# Patient Record
Sex: Female | Born: 1990 | Race: Black or African American | Hispanic: No | Marital: Single | Smoking: Former smoker
Health system: Southern US, Community
[De-identification: ages and names within clinical notes are randomized; demographics above are authoritative.]

## PROBLEM LIST (undated history)

## (undated) ENCOUNTER — Inpatient Hospital Stay: Payer: Self-pay

## (undated) DIAGNOSIS — F329 Major depressive disorder, single episode, unspecified: Secondary | ICD-10-CM

## (undated) DIAGNOSIS — O039 Complete or unspecified spontaneous abortion without complication: Secondary | ICD-10-CM

## (undated) DIAGNOSIS — F32A Depression, unspecified: Secondary | ICD-10-CM

## (undated) HISTORY — PX: OTHER SURGICAL HISTORY: SHX169

---

## 2016-05-22 ENCOUNTER — Emergency Department
Admission: EM | Admit: 2016-05-22 | Discharge: 2016-05-22 | Disposition: A | Discharge status: Home / Self Care | Payer: Self-pay | Attending: Student in an Organized Health Care Education/Training Program | Admitting: Student in an Organized Health Care Education/Training Program

## 2016-05-22 DIAGNOSIS — F32A Depression, unspecified: Secondary | ICD-10-CM

## 2016-05-22 DIAGNOSIS — R45851 Suicidal ideations: Secondary | ICD-10-CM

## 2016-05-22 DIAGNOSIS — F329 Major depressive disorder, single episode, unspecified: Secondary | ICD-10-CM | POA: Insufficient documentation

## 2016-05-22 DIAGNOSIS — F17210 Nicotine dependence, cigarettes, uncomplicated: Secondary | ICD-10-CM | POA: Insufficient documentation

## 2016-05-22 DIAGNOSIS — Z5181 Encounter for therapeutic drug level monitoring: Secondary | ICD-10-CM | POA: Insufficient documentation

## 2016-05-22 DIAGNOSIS — F1290 Cannabis use, unspecified, uncomplicated: Secondary | ICD-10-CM | POA: Insufficient documentation

## 2016-05-22 HISTORY — DX: Major depressive disorder, single episode, unspecified: F32.9

## 2016-05-22 HISTORY — DX: Depression, unspecified: F32.A

## 2016-05-22 LAB — COMPREHENSIVE METABOLIC PANEL
ALT: 9 U/L — ABNORMAL LOW (ref 14–54)
AST: 18 U/L (ref 15–41)
Albumin: 3.9 g/dL (ref 3.5–5.0)
Alkaline Phosphatase: 50 U/L (ref 38–126)
Anion gap: 6 (ref 5–15)
BUN: 9 mg/dL (ref 6–20)
CHLORIDE: 102 mmol/L (ref 101–111)
CO2: 29 mmol/L (ref 22–32)
CREATININE: 0.64 mg/dL (ref 0.44–1.00)
Calcium: 9.3 mg/dL (ref 8.9–10.3)
GFR calc Af Amer: >60 mL/min (ref >60)
GFR calc non Af Amer: >60 mL/min (ref >60)
GLUCOSE: 94 mg/dL (ref 65–99)
Potassium: 3.9 mmol/L (ref 3.5–5.1)
Sodium: 137 mmol/L (ref 135–145)
Total Bilirubin: 0.6 mg/dL (ref 0.3–1.2)
Total Protein: 7.9 g/dL (ref 6.5–8.1)

## 2016-05-22 LAB — RAPID HIV SCREEN (HIV 1/2 AB+AG)
HIV 1/2 Antibodies: NONREACTIVE
HIV-1 P24 Antigen - HIV24: NONREACTIVE

## 2016-05-22 LAB — CBC
HCT: 40.3 % (ref 35.0–47.0)
HEMOGLOBIN: 14.1 g/dL (ref 12.0–16.0)
MCH: 33.8 pg (ref 26.0–34.0)
MCHC: 34.9 g/dL (ref 32.0–36.0)
MCV: 96.7 fL (ref 80.0–100.0)
Platelets: 269 10*3/uL (ref 150–440)
RBC: 4.17 MIL/uL (ref 3.80–5.20)
RDW: 12.8 % (ref 11.5–14.5)
WBC: 7.8 10*3/uL (ref 3.6–11.0)

## 2016-05-22 LAB — URINE DRUG SCREEN, QUALITATIVE (ARMC ONLY)
AMPHETAMINES, UR SCREEN: NOT DETECTED
Barbiturates, Ur Screen: NOT DETECTED
Benzodiazepine, Ur Scrn: NOT DETECTED
CANNABINOID 50 NG, UR ~~LOC~~: POSITIVE — AB
COCAINE METABOLITE, UR ~~LOC~~: NOT DETECTED
MDMA (ECSTASY) UR SCREEN: NOT DETECTED
Methadone Scn, Ur: NOT DETECTED
Opiate, Ur Screen: NOT DETECTED
PHENCYCLIDINE (PCP) UR S: NOT DETECTED
Tricyclic, Ur Screen: NOT DETECTED

## 2016-05-22 LAB — ETHANOL: Alcohol, Ethyl (B): <5 mg/dL (ref <5)

## 2016-05-22 LAB — SALICYLATE LEVEL: Salicylate Lvl: <7 mg/dL (ref 2.8–30.0)

## 2016-05-22 LAB — ACETAMINOPHEN LEVEL: Acetaminophen (Tylenol), Serum: <10 ug/mL — ABNORMAL LOW (ref 10–30)

## 2016-05-22 NOTE — ED Notes (Signed)
SOC set up on pt's room.

## 2016-05-22 NOTE — ED Provider Notes (Addendum)
[facility] Emergency Department Provider Note    First MD Initiated Contact with Patient 05/22/16 1828     (approximate)  I have reviewed the triage vital signs and the nursing notes.   HISTORY  Chief Complaint Suicidal    HPI Belinda Gray is a 26 y.o. female presents with feelings of suicide and attempt of cutting her wrist and attempt to kill herself. Patient has felt more depressed and suicidal today after learning that her recent boyfriend was HIV-positive she found out about this today. She does have a history of suicide attempt to try to cut her wrist. Had similar feelings today. States that she is still feeling suicidal and would plan to cut her wrist. Hallucinations. Not on any medications for depression or anxiety.   Past Medical History:  Diagnosis Date  . Depression    FMH: no bleeding disorders History reviewed. No pertinent surgical history. There are no active problems to display for this patient.     Prior to Admission medications   Not on File    Allergies Patient has no known allergies.    Social History Social History  Substance Use Topics  . Smoking status: Current Every Day Smoker    Packs/day: 0.50    Types: Cigarettes  . Smokeless tobacco: Never Used  . Alcohol use Yes     Comment: occasional     Review of Systems Patient denies headaches, rhinorrhea, blurry vision, numbness, shortness of breath, chest pain, edema, cough, abdominal pain, nausea, vomiting, diarrhea, dysuria, fevers, rashes or hallucinations unless otherwise stated above in HPI. ____________________________________________   PHYSICAL EXAM:  VITAL SIGNS: Vitals:   05/22/16 1729  BP: 115/69  Pulse: 81  Resp: 18  Temp: 98 F (36.7 C)    Constitutional: Alert and oriented. Well appearing and in no acute distress. Eyes: Conjunctivae are normal. PERRL. EOMI. Head: Atraumatic. Nose: No congestion/rhinnorhea. Mouth/Throat: Mucous  membranes are moist.  Oropharynx non-erythematous. Neck: No stridor. Painless ROM. No cervical spine tenderness to palpation Hematological/Lymphatic/Immunilogical: No cervical lymphadenopathy. Cardiovascular: Normal rate, regular rhythm. Grossly normal heart sounds.  Good peripheral circulation. Respiratory: Normal respiratory effort.  No retractions. Lungs CTAB. Gastrointestinal: Soft and nontender. No distention. No abdominal bruits. No CVA tenderness. Genitourinary:  Musculoskeletal: No lower extremity tenderness nor edema.  No joint effusions. Neurologic:  Normal speech and language. No gross focal neurologic deficits are appreciated. No gait instability. Skin:  Skin is warm, dry and intact. No rash noted. Psychiatric: Mood and affect are normal. Speech and behavior are normal.  ____________________________________________   LABS (all labs ordered are listed, but only abnormal results are displayed)  Results for orders placed or performed during the hospital encounter of 05/22/16 (from the past 24 hour(s))  Comprehensive metabolic panel     Status: Abnormal   Collection Time: 05/22/16  5:35 PM  Result Value Ref Range   Sodium 137 135 - 145 mmol/L   Potassium 3.9 3.5 - 5.1 mmol/L   Chloride 102 101 - 111 mmol/L   CO2 29 22 - 32 mmol/L   Glucose, Bld 94 65 - 99 mg/dL   BUN 9 6 - 20 mg/dL   Creatinine, Ser 1.61 0.44 - 1.00 mg/dL   Calcium 9.3 8.9 - 09.6 mg/dL   Total Protein 7.9 6.5 - 8.1 g/dL   Albumin 3.9 3.5 - 5.0 g/dL   AST 18 15 - 41 U/L   ALT 9 (L) 14 - 54 U/L   Alkaline Phosphatase 50 38 - 126  U/L   Total Bilirubin 0.6 0.3 - 1.2 mg/dL   GFR calc non Af Amer >60 >60 mL/min   GFR calc Af Amer >60 >60 mL/min   Anion gap 6 5 - 15  Ethanol     Status: None   Collection Time: 05/22/16  5:35 PM  Result Value Ref Range   Alcohol, Ethyl (B) <5 <5 mg/dL  Salicylate level     Status: None   Collection Time: 05/22/16  5:35 PM  Result Value Ref Range   Salicylate Lvl <7.0 2.8  - 30.0 mg/dL  Acetaminophen level     Status: Abnormal   Collection Time: 05/22/16  5:35 PM  Result Value Ref Range   Acetaminophen (Tylenol), Serum <10 (L) 10 - 30 ug/mL  cbc     Status: None   Collection Time: 05/22/16  5:35 PM  Result Value Ref Range   WBC 7.8 3.6 - 11.0 K/uL   RBC 4.17 3.80 - 5.20 MIL/uL   Hemoglobin 14.1 12.0 - 16.0 g/dL   HCT 54.0 98.1 - 19.1 %   MCV 96.7 80.0 - 100.0 fL   MCH 33.8 26.0 - 34.0 pg   MCHC 34.9 32.0 - 36.0 g/dL   RDW 47.8 29.5 - 62.1 %   Platelets 269 150 - 440 K/uL  Urine Drug Screen, Qualitative     Status: Abnormal   Collection Time: 05/22/16  5:35 PM  Result Value Ref Range   Tricyclic, Ur Screen NONE DETECTED NONE DETECTED   Amphetamines, Ur Screen NONE DETECTED NONE DETECTED   MDMA (Ecstasy)Ur Screen NONE DETECTED NONE DETECTED   Cocaine Metabolite,Ur [place] NONE DETECTED NONE DETECTED   Opiate, Ur Screen NONE DETECTED NONE DETECTED   Phencyclidine (PCP) Ur S NONE DETECTED NONE DETECTED   Cannabinoid 50 Ng, Ur [place] POSITIVE (A) NONE DETECTED   Barbiturates, Ur Screen NONE DETECTED NONE DETECTED   Benzodiazepine, Ur Scrn NONE DETECTED NONE DETECTED   Methadone Scn, Ur NONE DETECTED NONE DETECTED   ____________________________________________  EKG____________________________________________  RADIOLOGY   ____________________________________________   PROCEDURES  Procedure(s) performed:  Procedures    Critical Care performed: no ____________________________________________   INITIAL IMPRESSION / ASSESSMENT AND PLAN / ED COURSE  Pertinent labs & imaging results that were available during my care of the patient were reviewed by me and considered in my medical decision making (see chart for details).  DDX: Psychosis, delirium, medication effect, noncompliance, polysubstance abuse, Si, Hi, depression   Belinda Gray is a 26 y.o. who presents to the ED with for evaluation of SI.  Patient has psych history of depression.   Laboratory testing was ordered to evaluation for underlying electrolyte derangement or signs of underlying organic pathology to explain today's presentation.  Based on history and physical and laboratory evaluation, it appears that the patient's presentation is 2/2 underlying psychiatric disorder and will require further evaluation and management by inpatient psychiatry.  Patient was  made an IVC due to suicidal ideations with plan.  Disposition pending psychiatric evaluation.     ----------------------------------------- 8:14 PM on 05/22/2016 -----------------------------------------  The patient has been evaluated at by [facility], psychiatry.  Patient is clinically stable.  Not felt to be a danger to self or others.  No SI or Hi.  No indication for inpatient psychiatric admission at this time.  Appropriate for continued outpatient therapy.   ____________________________________________   FINAL CLINICAL IMPRESSION(S) / ED DIAGNOSES  Final diagnoses:  Suicidal ideation  Depression, unspecified depression type      NEW  MEDICATIONS STARTED DURING THIS VISIT:  New Prescriptions   No medications on file     Note:  This document was prepared using Dragon voice recognition software and may include unintentional dictation errors.     Willy EddyPatrick Riki Gehring, MD 05/22/16 1903    Willy EddyPatrick Shamira Toutant, MD 05/22/16 2014

## 2016-05-22 NOTE — [facility] Counselor (Signed)
Patient presenting to the ED with suicidal thoughts of cutting her wrists, triggered by finding out that her boyfriend tested positive for HIV.  [facility] with follow up with outpatient therapy.  TTS Counselor will provide list of resources before discharge.

## 2016-05-22 NOTE — ED Notes (Signed)
[phone]  Joy, pts friend

## 2016-05-22 NOTE — ED Triage Notes (Signed)
Pt bib [employer] w/ SI. Pt sts that she recently found out former partner informed pt that he/she was HIV positive. Pt sts that she was texting crisis hotline who call [employer] to bring pt in.  Officer sts that paper work is coming.  Pt calm and cooperative at this time.  Denies plans at this time, no HI.  Denies drugs and ETOH use.

## 2016-07-17 DIAGNOSIS — O039 Complete or unspecified spontaneous abortion without complication: Secondary | ICD-10-CM

## 2016-07-17 HISTORY — DX: Complete or unspecified spontaneous abortion without complication: O03.9

## 2016-07-25 ENCOUNTER — Emergency Department
Admission: EM | Admit: 2016-07-25 | Discharge: 2016-07-25 | Disposition: A | Discharge status: Home / Self Care | Payer: Self-pay | Attending: Student in an Organized Health Care Education/Training Program | Admitting: Student in an Organized Health Care Education/Training Program

## 2016-07-25 ENCOUNTER — Emergency Department: Payer: Self-pay

## 2016-07-25 ENCOUNTER — Encounter: Payer: Self-pay | Admitting: Emergency Medicine

## 2016-07-25 DIAGNOSIS — R1031 Right lower quadrant pain: Secondary | ICD-10-CM | POA: Insufficient documentation

## 2016-07-25 DIAGNOSIS — F17210 Nicotine dependence, cigarettes, uncomplicated: Secondary | ICD-10-CM | POA: Insufficient documentation

## 2016-07-25 DIAGNOSIS — R102 Pelvic and perineal pain: Secondary | ICD-10-CM | POA: Insufficient documentation

## 2016-07-25 LAB — CBC WITH DIFFERENTIAL/PLATELET
Basophils Absolute: 0.1 10*3/uL (ref 0–0.1)
Basophils Relative: 1 %
EOS PCT: 2 %
Eosinophils Absolute: 0.1 10*3/uL (ref 0–0.7)
HCT: 35.4 % (ref 35.0–47.0)
Hemoglobin: 12.3 g/dL (ref 12.0–16.0)
LYMPHS ABS: 2.4 10*3/uL (ref 1.0–3.6)
LYMPHS PCT: 33 %
MCH: 32.9 pg (ref 26.0–34.0)
MCHC: 34.6 g/dL (ref 32.0–36.0)
MCV: 95.1 fL (ref 80.0–100.0)
Monocytes Absolute: 0.9 10*3/uL (ref 0.2–0.9)
Monocytes Relative: 12 %
Neutro Abs: 3.8 10*3/uL (ref 1.4–6.5)
Neutrophils Relative %: 52 %
PLATELETS: 213 10*3/uL (ref 150–440)
RBC: 3.73 MIL/uL — ABNORMAL LOW (ref 3.80–5.20)
RDW: 13 % (ref 11.5–14.5)
WBC: 7.3 10*3/uL (ref 3.6–11.0)

## 2016-07-25 LAB — BASIC METABOLIC PANEL
Anion gap: 5 (ref 5–15)
BUN: 13 mg/dL (ref 6–20)
CALCIUM: 8.7 mg/dL — AB (ref 8.9–10.3)
CO2: 26 mmol/L (ref 22–32)
Chloride: 104 mmol/L (ref 101–111)
Creatinine, Ser: 0.72 mg/dL (ref 0.44–1.00)
GFR calc Af Amer: >60 mL/min (ref >60)
GLUCOSE: 101 mg/dL — AB (ref 65–99)
POTASSIUM: 4.1 mmol/L (ref 3.5–5.1)
Sodium: 135 mmol/L (ref 135–145)

## 2016-07-25 LAB — URINALYSIS, COMPLETE (UACMP) WITH MICROSCOPIC
BACTERIA UA: NONE SEEN
Bilirubin Urine: NEGATIVE
Glucose, UA: NEGATIVE mg/dL
Hgb urine dipstick: NEGATIVE
KETONES UR: NEGATIVE mg/dL
Leukocytes, UA: NEGATIVE
Nitrite: NEGATIVE
PROTEIN: NEGATIVE mg/dL
Specific Gravity, Urine: 1.025 (ref 1.005–1.030)
pH: 5 (ref 5.0–8.0)

## 2016-07-25 LAB — WET PREP, GENITAL
CLUE CELLS WET PREP: NONE SEEN
Sperm: NONE SEEN
Trich, Wet Prep: NONE SEEN
Yeast Wet Prep HPF POC: NONE SEEN

## 2016-07-25 LAB — CHLAMYDIA/NGC RT PCR (ARMC ONLY)
CHLAMYDIA TR: NOT DETECTED
N gonorrhoeae: NOT DETECTED

## 2016-07-25 LAB — POCT PREGNANCY, URINE: PREG TEST UR: NEGATIVE

## 2016-07-25 MED ORDER — SODIUM CHLORIDE 0.9 % IV BOLUS (SEPSIS)
1000.0000 mL | Freq: Once | INTRAVENOUS | Status: AC
  Administered 2016-07-25: 1000 mL via INTRAVENOUS

## 2016-07-25 MED ORDER — IOPAMIDOL (ISOVUE-300) INJECTION 61%
100.0000 mL | Freq: Once | INTRAVENOUS | Status: AC | PRN
  Administered 2016-07-25: 100 mL via INTRAVENOUS
  Filled 2016-07-25: qty 100

## 2016-07-25 MED ORDER — DICYCLOMINE HCL 10 MG PO CAPS
10.0000 mg | ORAL_CAPSULE | Freq: Three times a day (TID) | ORAL | 0 refills | Status: DC | PRN
Start: 2016-07-25 — End: 2016-11-22

## 2016-07-25 NOTE — ED Provider Notes (Signed)
[facility] Emergency Department Provider Note    First MD Initiated Contact with Patient 07/25/16 [name]     (approximate)  I have reviewed the triage vital signs and the nursing notes.   HISTORY  Chief Complaint Pelvic Pain    HPI Belinda Gray is a 26 y.o. female presents with pelvic pain with nausea for the past 2-3 days. States that she is late on her period by roughly 1 week. Took a home pregnancy test that was negative. States the pain is crampy in nature. There is some right lower quadrant. States that she did have some watery vaginal discharge that she thought was her period coming on but it was not. She denies any dysuria. No fevers.   Past Medical History:  Diagnosis Date  . Depression    FMH: no bleeding disorders History reviewed. No pertinent surgical history. There are no active problems to display for this patient.     Prior to Admission medications   Not on File    Allergies Patient has no known allergies.    Social History Social History  Substance Use Topics  . Smoking status: Current Every Day Smoker    Packs/day: 0.50    Types: Cigarettes  . Smokeless tobacco: Never Used  . Alcohol use Yes     Comment: occasional     Review of Systems Patient denies headaches, rhinorrhea, blurry vision, numbness, shortness of breath, chest pain, edema, cough, abdominal pain, nausea, vomiting, diarrhea, dysuria, fevers, rashes or hallucinations unless otherwise stated above in HPI. ____________________________________________   PHYSICAL EXAM:  VITAL SIGNS: Vitals:   07/25/16 1740  BP: 117/68  Pulse: 83  Resp: 18  Temp: 98.7 F (37.1 C)    Constitutional: Alert and oriented. Well appearing and in no acute distress. Eyes: Conjunctivae are normal. PERRL. EOMI. Head: Atraumatic. Nose: No congestion/rhinnorhea. Mouth/Throat: Mucous membranes are moist.  Oropharynx non-erythematous. Neck: No stridor. Painless ROM. No  cervical spine tenderness to palpation Hematological/Lymphatic/Immunilogical: No cervical lymphadenopathy. Cardiovascular: Normal rate, regular rhythm. Grossly normal heart sounds.  Good peripheral circulation. Respiratory: Normal respiratory effort.  No retractions. Lungs CTAB. Gastrointestinal: Soft and nontender. No distention. No abdominal bruits. No CVA tenderness. Genitourinary: no CMT.  No adnexal masses,  Small area of irritated tissue to cervix at 11 oclock, no bleeding Musculoskeletal: No lower extremity tenderness nor edema.  No joint effusions. Neurologic:  Normal speech and language. No gross focal neurologic deficits are appreciated. No gait instability. Skin:  Skin is warm, dry and intact. No rash noted. Psychiatric: Mood and affect are normal. Speech and behavior are normal.  ____________________________________________   LABS (all labs ordered are listed, but only abnormal results are displayed)  Results for orders placed or performed during the hospital encounter of 07/25/16 (from the past 24 hour(s))  Urinalysis, Complete w Microscopic     Status: Abnormal   Collection Time: 07/25/16  5:53 PM  Result Value Ref Range   Color, Urine YELLOW (A) YELLOW   APPearance HAZY (A) CLEAR   Specific Gravity, Urine 1.025 1.005 - 1.030   pH 5.0 5.0 - 8.0   Glucose, UA NEGATIVE NEGATIVE mg/dL   Hgb urine dipstick NEGATIVE NEGATIVE   Bilirubin Urine NEGATIVE NEGATIVE   Ketones, ur NEGATIVE NEGATIVE mg/dL   Protein, ur NEGATIVE NEGATIVE mg/dL   Nitrite NEGATIVE NEGATIVE   Leukocytes, UA NEGATIVE NEGATIVE   RBC / HPF 0-5 0 - 5 RBC/hpf   WBC, UA 0-5 0 - 5 WBC/hpf   Bacteria,  UA NONE SEEN NONE SEEN   Squamous Epithelial / LPF 0-5 (A) NONE SEEN   Mucous PRESENT   Pregnancy, urine POC     Status: None   Collection Time: 07/25/16  5:59 PM  Result Value Ref Range   Preg Test, Ur NEGATIVE NEGATIVE    ____________________________________________  EKG____________________________________________  RADIOLOGY  I personally reviewed all radiographic images ordered to evaluate for the above acute complaints and reviewed radiology reports and findings.  These findings were personally discussed with the patient.  Please see medical record for radiology report.  ____________________________________________   PROCEDURES  Procedure(s) performed:  Procedures    Critical Care performed: no ____________________________________________   INITIAL IMPRESSION / ASSESSMENT AND PLAN / ED COURSE  Pertinent labs & imaging results that were available during my care of the patient were reviewed by me and considered in my medical decision making (see chart for details).  DDX: cyst, ectopic, uti, pid, appy  Belinda Gray is a 26 y.o. who presents to the ED with above complaints. Patient is AFVSS in ED. Exam as above. Given current presentation have considered the above differential.  Patient complains pelvic exam was performed which shows no evidence of PID. No significant discharge. Will be sent for cultures. Ultrasound ordered to evaluate for evidence of torsion or cyst. Her urine pregnancy is negative therefore not consistent with ectopic.   Clinical Course as of Jul 26 2227  Wed Jul 25, 2016  2120 Patient stating that she did remember earlier this morning that she did have some periumbilical pain. In the setting of a normal pelvic ultrasound with no evidence of significant infection do feel that CT imaging is indicated to evaluate for evidence of acute appendicitis.  [PR]    Clinical Course User Index [PR] Willy Eddy, MD   ----------------------------------------- 10:32 PM on 07/25/2016 -----------------------------------------  CT imaging with no acute abnormality. At this point do feel patient is stable for discharge home. Repeat abdominal exam not consistent with acute  surgical process.   Patient was able to tolerate PO and was able to ambulate with a steady gait.  Have discussed with the patient and available family all diagnostics and treatments performed thus far and all questions were answered to the best of my ability. The patient demonstrates understanding and agreement with plan.  ____________________________________________   FINAL CLINICAL IMPRESSION(S) / ED DIAGNOSES  Final diagnoses:  Pelvic pain  Pelvic pain in female  Right lower quadrant abdominal pain      NEW MEDICATIONS STARTED DURING THIS VISIT:  New Prescriptions   No medications on file     Note:  This document was prepared using Dragon voice recognition software and may include unintentional dictation errors.    Willy Eddy, MD 07/25/16 2233

## 2016-07-25 NOTE — ED Triage Notes (Signed)
C/O pelvic pain and nausea x 2-3 days.  LMP:  06/16/2016.  Home pregnancy test negative.  Denies dysuria.  NOticed watery vaginal discharge today.

## 2016-07-25 NOTE — Discharge Instructions (Signed)

## 2016-07-26 ENCOUNTER — Encounter: Payer: Self-pay | Admitting: Emergency Medicine

## 2016-07-26 ENCOUNTER — Emergency Department
Admission: EM | Admit: 2016-07-26 | Discharge: 2016-07-26 | Disposition: A | Discharge status: Home / Self Care | Payer: Self-pay | Attending: Emergency Medicine | Admitting: Emergency Medicine

## 2016-07-26 DIAGNOSIS — R1030 Lower abdominal pain, unspecified: Secondary | ICD-10-CM | POA: Insufficient documentation

## 2016-07-26 DIAGNOSIS — F17210 Nicotine dependence, cigarettes, uncomplicated: Secondary | ICD-10-CM | POA: Insufficient documentation

## 2016-07-26 DIAGNOSIS — R102 Pelvic and perineal pain: Secondary | ICD-10-CM | POA: Insufficient documentation

## 2016-07-26 LAB — CBC
HCT: 39.6 % (ref 35.0–47.0)
HEMOGLOBIN: 13.9 g/dL (ref 12.0–16.0)
MCH: 33.6 pg (ref 26.0–34.0)
MCHC: 35 g/dL (ref 32.0–36.0)
MCV: 96 fL (ref 80.0–100.0)
Platelets: 200 10*3/uL (ref 150–440)
RBC: 4.13 MIL/uL (ref 3.80–5.20)
RDW: 12.6 % (ref 11.5–14.5)
WBC: 6 10*3/uL (ref 3.6–11.0)

## 2016-07-26 LAB — COMPREHENSIVE METABOLIC PANEL
ALBUMIN: 3.8 g/dL (ref 3.5–5.0)
ALK PHOS: 46 U/L (ref 38–126)
ALT: 10 U/L — AB (ref 14–54)
ANION GAP: 9 (ref 5–15)
AST: 17 U/L (ref 15–41)
BUN: 9 mg/dL (ref 6–20)
CHLORIDE: 103 mmol/L (ref 101–111)
CO2: 23 mmol/L (ref 22–32)
CREATININE: 0.56 mg/dL (ref 0.44–1.00)
Calcium: 9.1 mg/dL (ref 8.9–10.3)
GFR calc Af Amer: >60 mL/min (ref >60)
GFR calc non Af Amer: >60 mL/min (ref >60)
GLUCOSE: 103 mg/dL — AB (ref 65–99)
Potassium: 3.7 mmol/L (ref 3.5–5.1)
SODIUM: 135 mmol/L (ref 135–145)
Total Bilirubin: 0.7 mg/dL (ref 0.3–1.2)
Total Protein: 7.6 g/dL (ref 6.5–8.1)

## 2016-07-26 LAB — URINALYSIS, COMPLETE (UACMP) WITH MICROSCOPIC
BACTERIA UA: NONE SEEN
Bilirubin Urine: NEGATIVE
Glucose, UA: NEGATIVE mg/dL
Ketones, ur: NEGATIVE mg/dL
Leukocytes, UA: NEGATIVE
Nitrite: NEGATIVE
PROTEIN: NEGATIVE mg/dL
SPECIFIC GRAVITY, URINE: 1.02 (ref 1.005–1.030)
pH: 8 (ref 5.0–8.0)

## 2016-07-26 LAB — LIPASE, BLOOD: LIPASE: 17 U/L (ref 11–51)

## 2016-07-26 MED ORDER — KETOROLAC TROMETHAMINE 30 MG/ML IJ SOLN
30.0000 mg | Freq: Once | INTRAMUSCULAR | Status: AC
  Administered 2016-07-26: 30 mg via INTRAVENOUS
  Filled 2016-07-26: qty 1

## 2016-07-26 MED ORDER — KETOROLAC TROMETHAMINE 10 MG PO TABS: 10.0000 mg | ORAL_TABLET | Freq: Three times a day (TID) | ORAL | 0 refills | Status: DC | PRN

## 2016-07-26 NOTE — Discharge Instructions (Signed)
Please seek medical attention for any high fevers, chest pain, shortness of breath, change in behavior, persistent vomiting, bloody stool or any other new or concerning symptoms.  

## 2016-07-26 NOTE — ED Triage Notes (Signed)
Pt reports pelvic pain, was here yesterday for same and d/c'ed home; pt reports pain has gotten worse so came back

## 2016-07-26 NOTE — ED Provider Notes (Signed)
[facility] Emergency Department Provider Note   ____________________________________________   I have reviewed the triage vital signs and the nursing notes.   HISTORY  Chief Complaint Abdominal Pain   History limited by: Not Limited   HPI Belinda Gray is a 26 y.o. female who presents to the emergency department today with concerns for continued abdominal pain. Patient states she has been having some cramping for the past 10 days. It has gotten worse the past couple of days. She was seen in the emergency department yesterday for this and underwent pelvic exam, pelvic ultrasound as well as CT scan. None of the tests came back with an obvious etiology of the patient's pain. She was prescribed Bentyl however has not taken any. Today she did notice some vaginal bleeding. Additionally she felt warm and had a temperature of 99.2.    Past Medical History:  Diagnosis Date  . Depression     There are no active problems to display for this patient.   History reviewed. No pertinent surgical history.  Prior to Admission medications   Medication Sig Start Date End Date Taking? Authorizing Provider  dicyclomine (BENTYL) 10 MG capsule Take 1 capsule (10 mg total) by mouth 3 (three) times daily as needed for spasms. 07/25/16 08/08/16  Willy Eddyobinson, Patrick, MD    Allergies Patient has no known allergies.  History reviewed. No pertinent family history.  Social History Social History  Substance Use Topics  . Smoking status: Current Every Day Smoker    Packs/day: 0.50    Types: Cigarettes  . Smokeless tobacco: Never Used  . Alcohol use Yes     Comment: occasional     Review of Systems Constitutional: No fever/chills Eyes: No visual changes. ENT: No sore throat. Cardiovascular: Denies chest pain. Respiratory: Denies shortness of breath. Gastrointestinal: Positive for abdominal pain. Genitourinary: Negative for dysuria. Musculoskeletal: Negative for back  pain. Skin: Negative for rash. Neurological: Negative for headaches, focal weakness or numbness.  ____________________________________________   PHYSICAL EXAM:  VITAL SIGNS: ED Triage Vitals  Enc Vitals Group     BP 07/26/16 1837 (!) 125/97     Pulse Rate 07/26/16 1837 86     Resp 07/26/16 1837 19     Temp 07/26/16 1837 98.2 F (36.8 C)     Temp Source 07/26/16 1837 Oral     SpO2 07/26/16 1837 100 %     Weight 07/26/16 1838 222 lb (100.7 kg)     Height 07/26/16 1838 5\' 6"  (1.676 m)     Head Circumference --      Peak Flow --      Pain Score 07/26/16 1841 8   Constitutional: Alert and oriented. Well appearing and in no distress. Eyes: Conjunctivae are normal. Normal extraocular movements. ENT   Head: Normocephalic and atraumatic.   Nose: No congestion/rhinnorhea.   Mouth/Throat: Mucous membranes are moist.   Neck: No stridor. Hematological/Lymphatic/Immunilogical: No cervical lymphadenopathy. Cardiovascular: Normal rate, regular rhythm.  No murmurs, rubs, or gallops.  Respiratory: Normal respiratory effort without tachypnea nor retractions. Breath sounds are clear and equal bilaterally. No wheezes/rales/rhonchi. Gastrointestinal: Soft and non tender. No rebound. No guarding.  Genitourinary: Deferred Musculoskeletal: Normal range of motion in all extremities. No lower extremity edema. Neurologic:  Normal speech and language. No gross focal neurologic deficits are appreciated.  Skin:  Skin is warm, dry and intact. No rash noted. Psychiatric: Mood and affect are normal. Speech and behavior are normal. Patient exhibits appropriate insight and judgment.  ____________________________________________  LABS (pertinent positives/negatives)  Labs Reviewed  COMPREHENSIVE METABOLIC PANEL - Abnormal; Notable for the following:       Result Value   Glucose, Bld 103 (*)    ALT 10 (*)    All other components within normal limits  URINALYSIS, COMPLETE (UACMP) WITH  MICROSCOPIC - Abnormal; Notable for the following:    Color, Urine YELLOW (*)    APPearance CLEAR (*)    Hgb urine dipstick SMALL (*)    Squamous Epithelial / LPF 0-5 (*)    All other components within normal limits  LIPASE, BLOOD  CBC  PREGNANCY, URINE     ____________________________________________   EKG  None  ____________________________________________    RADIOLOGY  None  ____________________________________________   PROCEDURES  Procedures  ____________________________________________   INITIAL IMPRESSION / ASSESSMENT AND PLAN / ED COURSE  Pertinent labs & imaging results that were available during my care of the patient were reviewed by me and considered in my medical decision making (see chart for details).  Patient presented to the emergency department today with concerns for continued abdominal pain. Patient was monitored in the emergency yesterday with negative ultrasound, pelvic exam and CT scan. Blood work here in the emergency department without any concerning findings per patient did feel significant improvement after Toradol. This point unclear etiology of the patient's pain. Will give her OB/GYN follow-up.  ____________________________________________   FINAL CLINICAL IMPRESSION(S) / ED DIAGNOSES  Final diagnoses:  Lower abdominal pain     Note: This dictation was prepared with Dragon dictation. Any transcriptional errors that result from this process are unintentional     Phineas Semen, MD 07/26/16 2029

## 2016-11-15 ENCOUNTER — Emergency Department
Admission: EM | Admit: 2016-11-15 | Discharge: 2016-11-15 | Disposition: A | Discharge status: Home / Self Care | Payer: Medicaid Other | Attending: Emergency Medicine | Admitting: Emergency Medicine

## 2016-11-15 ENCOUNTER — Encounter: Payer: Self-pay | Admitting: Emergency Medicine

## 2016-11-15 DIAGNOSIS — N898 Other specified noninflammatory disorders of vagina: Secondary | ICD-10-CM | POA: Diagnosis not present

## 2016-11-15 DIAGNOSIS — O23592 Infection of other part of genital tract in pregnancy, second trimester: Secondary | ICD-10-CM | POA: Insufficient documentation

## 2016-11-15 DIAGNOSIS — N760 Acute vaginitis: Secondary | ICD-10-CM

## 2016-11-15 DIAGNOSIS — O9989 Other specified diseases and conditions complicating pregnancy, childbirth and the puerperium: Secondary | ICD-10-CM | POA: Insufficient documentation

## 2016-11-15 DIAGNOSIS — Z79899 Other long term (current) drug therapy: Secondary | ICD-10-CM | POA: Insufficient documentation

## 2016-11-15 DIAGNOSIS — R109 Unspecified abdominal pain: Secondary | ICD-10-CM

## 2016-11-15 DIAGNOSIS — B9689 Other specified bacterial agents as the cause of diseases classified elsewhere: Secondary | ICD-10-CM

## 2016-11-15 DIAGNOSIS — O26892 Other specified pregnancy related conditions, second trimester: Secondary | ICD-10-CM

## 2016-11-15 DIAGNOSIS — Z3A16 16 weeks gestation of pregnancy: Secondary | ICD-10-CM | POA: Diagnosis not present

## 2016-11-15 HISTORY — DX: Complete or unspecified spontaneous abortion without complication: O03.9

## 2016-11-15 LAB — POCT PREGNANCY, URINE: PREG TEST UR: POSITIVE — AB

## 2016-11-15 LAB — URINALYSIS, COMPLETE (UACMP) WITH MICROSCOPIC
BILIRUBIN URINE: NEGATIVE
Bacteria, UA: NONE SEEN
GLUCOSE, UA: NEGATIVE mg/dL
Hgb urine dipstick: NEGATIVE
Ketones, ur: 5 mg/dL — AB
LEUKOCYTES UA: NEGATIVE
Nitrite: NEGATIVE
PH: 6 (ref 5.0–8.0)
Protein, ur: 30 mg/dL — AB
SPECIFIC GRAVITY, URINE: 1.03 (ref 1.005–1.030)

## 2016-11-15 LAB — COMPREHENSIVE METABOLIC PANEL
ALBUMIN: 3.2 g/dL — AB (ref 3.5–5.0)
ALK PHOS: 40 U/L (ref 38–126)
ALT: 12 U/L — AB (ref 14–54)
AST: 16 U/L (ref 15–41)
Anion gap: 9 (ref 5–15)
BILIRUBIN TOTAL: 0.4 mg/dL (ref 0.3–1.2)
BUN: 9 mg/dL (ref 6–20)
CALCIUM: 8.9 mg/dL (ref 8.9–10.3)
CO2: 19 mmol/L — ABNORMAL LOW (ref 22–32)
CREATININE: 0.55 mg/dL (ref 0.44–1.00)
Chloride: 106 mmol/L (ref 101–111)
Glucose, Bld: 83 mg/dL (ref 65–99)
Potassium: 3.4 mmol/L — ABNORMAL LOW (ref 3.5–5.1)
Sodium: 134 mmol/L — ABNORMAL LOW (ref 135–145)
TOTAL PROTEIN: 6.5 g/dL (ref 6.5–8.1)

## 2016-11-15 LAB — WET PREP, GENITAL
Sperm: NONE SEEN
Trich, Wet Prep: NONE SEEN
YEAST WET PREP: NONE SEEN

## 2016-11-15 LAB — CBC
HCT: 35.1 % (ref 35.0–47.0)
Hemoglobin: 12.5 g/dL (ref 12.0–16.0)
MCH: 34 pg (ref 26.0–34.0)
MCHC: 35.6 g/dL (ref 32.0–36.0)
MCV: 95.4 fL (ref 80.0–100.0)
PLATELETS: 188 10*3/uL (ref 150–440)
RBC: 3.68 MIL/uL — ABNORMAL LOW (ref 3.80–5.20)
RDW: 12.4 % (ref 11.5–14.5)
WBC: 10.1 10*3/uL (ref 3.6–11.0)

## 2016-11-15 LAB — HCG, QUANTITATIVE, PREGNANCY: hCG, Beta Chain, Quant, S: 32777 m[IU]/mL — ABNORMAL HIGH (ref <5)

## 2016-11-15 LAB — CHLAMYDIA/NGC RT PCR (ARMC ONLY)
CHLAMYDIA TR: NOT DETECTED
N gonorrhoeae: NOT DETECTED

## 2016-11-15 LAB — LIPASE, BLOOD: Lipase: 15 U/L (ref 11–51)

## 2016-11-15 MED ORDER — ACETAMINOPHEN 500 MG PO TABS
1000.0000 mg | ORAL_TABLET | Freq: Once | ORAL | Status: AC
  Administered 2016-11-15: 1000 mg via ORAL

## 2016-11-15 MED ORDER — METRONIDAZOLE 500 MG PO TABS
500.0000 mg | ORAL_TABLET | Freq: Once | ORAL | Status: AC
  Administered 2016-11-15: 500 mg via ORAL
  Filled 2016-11-15: qty 1

## 2016-11-15 MED ORDER — METRONIDAZOLE 500 MG PO TABS: 500.0000 mg | ORAL_TABLET | Freq: Two times a day (BID) | ORAL | 0 refills | Status: AC

## 2016-11-15 MED ORDER — ACETAMINOPHEN 500 MG PO TABS
ORAL_TABLET | ORAL | Status: AC
  Administered 2016-11-15: 1000 mg via ORAL
  Filled 2016-11-15: qty 2

## 2016-11-15 NOTE — ED Provider Notes (Signed)
[facility] Emergency Department Provider Note  ____________________________________________  Time seen: Approximately 8:16 PM  I have reviewed the triage vital signs and the nursing notes.   HISTORY  Chief Complaint Abdominal Pain   HPI Belinda Gray is a 26 y.o. female G2 P0 currently at [redacted] weeks gestational age her ultrasound done in 9 weeks who presents for evaluation of abdominal pain and vaginal discharge.Patient reports 3 days of intermittent bilateral sharp abdominal pain associated with nausea. She has had 1 episode a day lasting about 30-45 minutes. She denies contractions, water loss through her vagina, vaginal bleeding, dysuria, hematuria, vomiting, diarrhea, constipation. Patient has not had any other prenatal care since receiving an ultrasound at 9 weeks. She is taking prenatal vitamins. She is complaining of vaginal discharge for the same amount of dates. She is feeling baby moving. No abdominal pain at this time  Past Medical History:  Diagnosis Date  . Depression   . Miscarriage 07/2016    There are no active problems to display for this patient.   History reviewed. No pertinent surgical history.  Prior to Admission medications   Medication Sig Start Date End Date Taking? Authorizing Provider  dicyclomine (BENTYL) 10 MG capsule Take 1 capsule (10 mg total) by mouth 3 (three) times daily as needed for spasms. 07/25/16 08/08/16  Willy Eddy, MD  ketorolac (TORADOL) 10 MG tablet Take 1 tablet (10 mg total) by mouth every 8 (eight) hours as needed for severe pain. 07/26/16   Phineas Semen, MD  metroNIDAZOLE (FLAGYL) 500 MG tablet Take 1 tablet (500 mg total) by mouth 2 (two) times daily. 11/15/16 11/22/16  Nita Sickle, MD    Allergies Patient has no known allergies.  History reviewed. No pertinent family history.  Social History Social History  Substance Use Topics  . Smoking status: Former Smoker    Packs/day: 0.50   Types: Cigarettes    Quit date: 08/2016  . Smokeless tobacco: Never Used  . Alcohol use Yes     Comment: occasional     Review of Systems  Constitutional: Negative for fever. Eyes: Negative for visual changes. ENT: Negative for sore throat. Neck: No neck pain  Cardiovascular: Negative for chest pain. Respiratory: Negative for shortness of breath. Gastrointestinal: +  abdominal pain and nausea. No vomiting or diarrhea. Genitourinary: Negative for dysuria. + vaginal discharge Musculoskeletal: Negative for back pain. Skin: Negative for rash. Neurological: Negative for headaches, weakness or numbness. Psych: No SI or HI  ____________________________________________   PHYSICAL EXAM:  VITAL SIGNS: ED Triage Vitals  Enc Vitals Group     BP 11/15/16 1722 112/65     Pulse Rate 11/15/16 1723 76     Resp 11/15/16 1723 18     Temp 11/15/16 1723 97.6 F (36.4 C)     Temp Source 11/15/16 1723 Oral     SpO2 11/15/16 1723 100 %     Weight 11/15/16 1724 230 lb (104.3 kg)     Height 11/15/16 1724 5\' 6"  (1.676 m)     Head Circumference --      Peak Flow --      Pain Score 11/15/16 1723 6     Pain Loc --      Pain Edu? --      Excl. in GC? --     Constitutional: Alert and oriented. Well appearing and in no apparent distress. HEENT:      Head: Normocephalic and atraumatic.         Eyes:  Conjunctivae are normal. Sclera is non-icteric.       Mouth/Throat: Mucous membranes are moist.       Neck: Supple with no signs of meningismus. Cardiovascular: Regular rate and rhythm. No murmurs, gallops, or rubs. 2+ symmetrical distal pulses are present in all extremities. No JVD. Respiratory: Normal respiratory effort. Lungs are clear to auscultation bilaterally. No wheezes, crackles, or rhonchi.  Gastrointestinal: Gravid, non tender, and non distended with positive bowel sounds. No rebound or guarding. Genitourinary: No CVA tenderness. Pelvic exam: Normal external genitalia, no rashes or  lesions. Normal cervical mucus. Os closed. No cervical motion tenderness.  No uterine or adnexal tenderness.   Musculoskeletal: Nontender with normal range of motion in all extremities. No edema, cyanosis, or erythema of extremities. Neurologic: Normal speech and language. Face is symmetric. Moving all extremities. No gross focal neurologic deficits are appreciated. Skin: Skin is warm, dry and intact. No rash noted. Psychiatric: Mood and affect are normal. Speech and behavior are normal.  ____________________________________________   LABS (all labs ordered are listed, but only abnormal results are displayed)  Labs Reviewed  WET PREP, GENITAL - Abnormal; Notable for the following:       Result Value   Clue Cells Wet Prep HPF POC PRESENT (*)    WBC, Wet Prep HPF POC MODERATE (*)    All other components within normal limits  COMPREHENSIVE METABOLIC PANEL - Abnormal; Notable for the following:    Sodium 134 (*)    Potassium 3.4 (*)    CO2 19 (*)    Albumin 3.2 (*)    ALT 12 (*)    All other components within normal limits  CBC - Abnormal; Notable for the following:    RBC 3.68 (*)    All other components within normal limits  URINALYSIS, COMPLETE (UACMP) WITH MICROSCOPIC - Abnormal; Notable for the following:    Color, Urine YELLOW (*)    APPearance CLEAR (*)    Ketones, ur 5 (*)    Protein, ur 30 (*)    Squamous Epithelial / LPF 0-5 (*)    All other components within normal limits  HCG, QUANTITATIVE, PREGNANCY - Abnormal; Notable for the following:    hCG, Beta Chain, Quant, S 32,777 (*)    All other components within normal limits  POCT PREGNANCY, URINE - Abnormal; Notable for the following:    Preg Test, Ur POSITIVE (*)    All other components within normal limits  CHLAMYDIA/NGC RT PCR ([facility] ONLY)  LIPASE, BLOOD  POC URINE PREG, ED   ____________________________________________  EKG  none  ____________________________________________  RADIOLOGY  none    ____________________________________________   PROCEDURES  Procedure(s) performed:yes Procedures   Bedside ultrasound: IUP, normal fetal movement, FHR 152  Critical Care performed:  None ____________________________________________   INITIAL IMPRESSION / ASSESSMENT AND PLAN / ED COURSE  26 y.o. female G2 P0 currently at [redacted] weeks gestational age her ultrasound done in 9 weeks who presents for evaluation of abdominal pain and vaginal discharge. Patient has no abdominal pain at this time. Bedside ultrasound showing a normal fetal movement with normal fetal heart rate. Abdomen is gravid with no tenderness throughout. I explained to the patient that this could be round ligament pain however if the pain returns and remained stable or localizes to one specific location is to return to the emergency room. I also explained she needs to return for any vaginal bleeding or fluid loss through her vagina. Pelvic exam showing a closed os with normal vaginal  mucus. Wet prep positive for BV. All other STDs negative. Patient was started on Flagyl. She is going to be referred to the health department for prenatal care. Discussed return precautions with patient.     Pertinent labs & imaging results that were available during my care of the patient were reviewed by me and considered in my medical decision making (see chart for details).    ____________________________________________   FINAL CLINICAL IMPRESSION(S) / ED DIAGNOSES  Final diagnoses:  Abdominal pain during pregnancy in second trimester  Bacterial vaginosis      NEW MEDICATIONS STARTED DURING THIS VISIT:  New Prescriptions   METRONIDAZOLE (FLAGYL) 500 MG TABLET    Take 1 tablet (500 mg total) by mouth 2 (two) times daily.     Note:  This document was prepared using Dragon voice recognition software and may include unintentional dictation errors.    Nita SickleVeronese, [place], MD 11/15/16 2021

## 2016-11-15 NOTE — ED Triage Notes (Signed)
Pt presents to ED from her work c/o sharp lower abd pain 6/10 x3 days. [redacted] weeks pregnant, one other pregnancy: miscarriage in May.  +nausea, no vomiting. LBM 11/14/16. +vaginal discharge - cloudy white. Denies vaginal bleeding.

## 2016-11-15 NOTE — ED Notes (Signed)

## 2016-11-22 ENCOUNTER — Ambulatory Visit (INDEPENDENT_AMBULATORY_CARE_PROVIDER_SITE_OTHER): Payer: Medicaid Other | Admitting: Certified Nurse Midwife

## 2016-11-22 ENCOUNTER — Encounter: Payer: Self-pay | Admitting: Certified Nurse Midwife

## 2016-11-22 VITALS — BP 109/72 | HR 72 | Ht 66.0 in | Wt 227.7 lb

## 2016-11-22 DIAGNOSIS — L818 Other specified disorders of pigmentation: Secondary | ICD-10-CM

## 2016-11-22 DIAGNOSIS — Z3482 Encounter for supervision of other normal pregnancy, second trimester: Secondary | ICD-10-CM

## 2016-11-22 DIAGNOSIS — E669 Obesity, unspecified: Secondary | ICD-10-CM

## 2016-11-22 DIAGNOSIS — Z113 Encounter for screening for infections with a predominantly sexual mode of transmission: Secondary | ICD-10-CM

## 2016-11-22 DIAGNOSIS — O0930 Supervision of pregnancy with insufficient antenatal care, unspecified trimester: Secondary | ICD-10-CM

## 2016-11-22 DIAGNOSIS — Z1389 Encounter for screening for other disorder: Secondary | ICD-10-CM

## 2016-11-22 DIAGNOSIS — Z3689 Encounter for other specified antenatal screening: Secondary | ICD-10-CM

## 2016-11-22 NOTE — Patient Instructions (Signed)
Round Ligament Pain The round ligament is a cord of muscle and tissue that helps to support the uterus. It can become a source of pain during pregnancy if it becomes stretched or twisted as the baby grows. The pain usually begins in the second trimester of pregnancy, and it can come and go until the baby is delivered. It is not a serious problem, and it does not cause harm to the baby. Round ligament pain is usually a short, sharp, and pinching pain, but it can also be a dull, lingering, and aching pain. The pain is felt in the lower side of the abdomen or in the groin. It usually starts deep in the groin and moves up to the outside of the hip area. Pain can occur with:  A sudden change in position.  Rolling over in bed.  Coughing or sneezing.  Physical activity.  Follow these instructions at home: Watch your condition for any changes. Take these steps to help with your pain:  When the pain starts, relax. Then try: ? Sitting down. ? Flexing your knees up to your abdomen. ? Lying on your side with one pillow under your abdomen and another pillow between your legs. ? Sitting in a warm bath for 15-20 minutes or until the pain goes away.  Take over-the-counter and prescription medicines only as told by your health care provider.  Move slowly when you sit and stand.  Avoid long walks if they cause pain.  Stop or lessen your physical activities if they cause pain.  Contact a health care provider if:  Your pain does not go away with treatment.  You feel pain in your back that you did not have before.  Your medicine is not helping. Get help right away if:  You develop a fever or chills.  You develop uterine contractions.  You develop vaginal bleeding.  You develop nausea or vomiting.  You develop diarrhea.  You have pain when you urinate. This information is not intended to replace advice given to you by your health care provider. Make sure you discuss any questions you have  with your health care provider. Document Released: 12/13/2007 Document Revised: 08/11/2015 Document Reviewed: 05/12/2014 Elsevier Interactive Patient Education  2018 [employer]. Alpha-Fetoprotein Test Why am I having this test? This test is used to screen for birth defects, such as chromosomal abnormalities, neural tube defects, and body wall defects. It can also be used as a tumor marker for certain cancers. Alpha-Fetoprotein (AFP) is a protein that is made by the fetal liver. Levels can be detected during pregnancy, starting at [redacted] weeks gestation and peaking at 16-[redacted] weeks gestation. Your health care provider may perform this test if you are pregnant or if a tumor is suspected. What kind of sample is taken? A blood sample is required for this test. It is usually collected by inserting a needle into a vein. How do I prepare for this test? There is no preparation or fasting required for this test. What are the reference ranges? References ranges are considered healthy ranges established after testing a large group of healthy people. Reference ranges may vary among different people, labs, and hospitals. It is your responsibility to obtain your test results. Ask the lab or department performing the test when and how you will get your results. Reference ranges for AFP are the following:  Adult: Less than 40 ng/mL or less than 40 mcg/L (SI units).  Child younger than 1 year: Less than 30 ng/mL.  Ranges are  stratified by weeks of gestation, and they vary among laboratories. What do the results mean? Values above the reference ranges in pregnant women may indicate:  Neural tube defects.  Abdominal wall defects.  Multiple pregnancy.  Congenital abnormalities.  Fetal distress or fetal death.  Values above the reference ranges in nonpregnant women may indicate:  Reproductive cancers.  Liver cancer.  Liver cell death.  Other types of cancer.  Values below the reference ranges in  pregnant women may indicate:  Down syndrome.  Fetal death.  Talk with your health care provider to discuss your results, treatment options, and if necessary, the need for more tests. Talk with your health care provider if you have any questions about your results. Talk with your health care provider to discuss your results, treatment options, and if necessary, the need for more tests. Talk with your health care provider if you have any questions about your results. This information is not intended to replace advice given to you by your health care provider. Make sure you discuss any questions you have with your health care provider. Document Released: 03/29/2004 Document Revised: 11/07/2015 Document Reviewed: 08/07/2013 Elsevier Interactive Patient Education  2018 [employer]. Second Trimester of Pregnancy The second trimester is from week 14 through week 27 (months 4 through 6). The second trimester is often a time when you feel your best. Your body has adjusted to being pregnant, and you begin to feel better physically. Usually, morning sickness has lessened or quit completely, you may have more energy, and you may have an increase in appetite. The second trimester is also a time when the fetus is growing rapidly. At the end of the sixth month, the fetus is about 9 inches long and weighs about 1 pounds. You will likely begin to feel the baby move (quickening) between 16 and 20 weeks of pregnancy. Body changes during your second trimester Your body continues to go through many changes during your second trimester. The changes vary from woman to woman.  Your weight will continue to increase. You will notice your lower abdomen bulging out.  You may begin to get stretch marks on your hips, abdomen, and breasts.  You may develop headaches that can be relieved by medicines. The medicines should be approved by your health care provider.  You may urinate more often because the fetus is pressing on  your bladder.  You may develop or continue to have heartburn as a result of your pregnancy.  You may develop constipation because certain hormones are causing the muscles that push waste through your intestines to slow down.  You may develop hemorrhoids or swollen, bulging veins (varicose veins).  You may have back pain. This is caused by: ? Weight gain. ? Pregnancy hormones that are relaxing the joints in your pelvis. ? A shift in weight and the muscles that support your balance.  Your breasts will continue to grow and they will continue to become tender.  Your gums may bleed and may be sensitive to brushing and flossing.  Dark spots or blotches (chloasma, mask of pregnancy) may develop on your face. This will likely fade after the baby is born.  A dark line from your belly button to the pubic area (linea nigra) may appear. This will likely fade after the baby is born.  You may have changes in your hair. These can include thickening of your hair, rapid growth, and changes in texture. Some women also have hair loss during or after pregnancy, or hair that  feels dry or thin. Your hair will most likely return to normal after your baby is born.  What to expect at prenatal visits During a routine prenatal visit:  You will be weighed to make sure you and the fetus are growing normally.  Your blood pressure will be taken.  Your abdomen will be measured to track your baby's growth.  The fetal heartbeat will be listened to.  Any test results from the previous visit will be discussed.  Your health care provider may ask you:  How you are feeling.  If you are feeling the baby move.  If you have had any abnormal symptoms, such as leaking fluid, bleeding, severe headaches, or abdominal cramping.  If you are using any tobacco products, including cigarettes, chewing tobacco, and electronic cigarettes.  If you have any questions.  Other tests that may be performed during your second  trimester include:  Blood tests that check for: ? Low iron levels (anemia). ? High blood sugar that affects pregnant women (gestational diabetes) between 55 and 28 weeks. ? Rh antibodies. This is to check for a protein on red blood cells (Rh factor).  Urine tests to check for infections, diabetes, or protein in the urine.  An ultrasound to confirm the proper growth and development of the baby.  An amniocentesis to check for possible genetic problems.  Fetal screens for spina bifida and Down syndrome.  HIV (human immunodeficiency virus) testing. Routine prenatal testing includes screening for HIV, unless you choose not to have this test.  Follow these instructions at home: Medicines  Follow your health care provider's instructions regarding medicine use. Specific medicines may be either safe or unsafe to take during pregnancy.  Take a prenatal vitamin that contains at least 600 micrograms (mcg) of folic acid.  If you develop constipation, try taking a stool softener if your health care provider approves. Eating and drinking  Eat a balanced diet that includes fresh fruits and vegetables, whole grains, good sources of protein such as meat, eggs, or tofu, and low-fat dairy. Your health care provider will help you determine the amount of weight gain that is right for you.  Avoid raw meat and uncooked cheese. These carry germs that can cause birth defects in the baby.  If you have low calcium intake from food, talk to your health care provider about whether you should take a daily calcium supplement.  Limit foods that are high in fat and processed sugars, such as fried and sweet foods.  To prevent constipation: ? Drink enough fluid to keep your urine clear or pale yellow. ? Eat foods that are high in fiber, such as fresh fruits and vegetables, whole grains, and beans. Activity  Exercise only as directed by your health care provider. Most women can continue their usual exercise  routine during pregnancy. Try to exercise for 30 minutes at least 5 days a week. Stop exercising if you experience uterine contractions.  Avoid heavy lifting, wear low heel shoes, and practice good posture.  A sexual relationship may be continued unless your health care provider directs you otherwise. Relieving pain and discomfort  Wear a good support bra to prevent discomfort from breast tenderness.  Take warm sitz baths to soothe any pain or discomfort caused by hemorrhoids. Use hemorrhoid cream if your health care provider approves.  Rest with your legs elevated if you have leg cramps or low back pain.  If you develop varicose veins, wear support hose. Elevate your feet for 15 minutes, 3-4 times  a day. Limit salt in your diet. Prenatal Care  Write down your questions. Take them to your prenatal visits.  Keep all your prenatal visits as told by your health care provider. This is important. Safety  Wear your seat belt at all times when driving.  Make a list of emergency phone numbers, including numbers for family, friends, the hospital, and police and fire departments. General instructions  Ask your health care provider for a referral to a local prenatal education class. Begin classes no later than the beginning of month 6 of your pregnancy.  Ask for help if you have counseling or nutritional needs during pregnancy. Your health care provider can offer advice or refer you to specialists for help with various needs.  Do not use hot tubs, steam rooms, or saunas.  Do not douche or use tampons or scented sanitary pads.  Do not cross your legs for long periods of time.  Avoid cat litter boxes and soil used by cats. These carry germs that can cause birth defects in the baby and possibly loss of the fetus by miscarriage or stillbirth.  Avoid all smoking, herbs, alcohol, and unprescribed drugs. Chemicals in these products can affect the formation and growth of the baby.  Do not use  any products that contain nicotine or tobacco, such as cigarettes and e-cigarettes. If you need help quitting, ask your health care provider.  Visit your dentist if you have not gone yet during your pregnancy. Use a soft toothbrush to brush your teeth and be gentle when you floss. Contact a health care provider if:  You have dizziness.  You have mild pelvic cramps, pelvic pressure, or nagging pain in the abdominal area.  You have persistent nausea, vomiting, or diarrhea.  You have a bad smelling vaginal discharge.  You have pain when you urinate. Get help right away if:  You have a fever.  You are leaking fluid from your vagina.  You have spotting or bleeding from your vagina.  You have severe abdominal cramping or pain.  You have rapid weight gain or weight loss.  You have shortness of breath with chest pain.  You notice sudden or extreme swelling of your face, hands, ankles, feet, or legs.  You have not felt your baby move in over an hour.  You have severe headaches that do not go away when you take medicine.  You have vision changes. Summary  The second trimester is from week 14 through week 27 (months 4 through 6). It is also a time when the fetus is growing rapidly.  Your body goes through many changes during pregnancy. The changes vary from woman to woman.  Avoid all smoking, herbs, alcohol, and unprescribed drugs. These chemicals affect the formation and growth your baby.  Do not use any tobacco products, such as cigarettes, chewing tobacco, and e-cigarettes. If you need help quitting, ask your health care provider.  Contact your health care provider if you have any questions. Keep all prenatal visits as told by your health care provider. This is important. This information is not intended to replace advice given to you by your health care provider. Make sure you discuss any questions you have with your health care provider. Document Released: 02/27/2001 Document  Revised: 08/11/2015 Document Reviewed: 05/06/2012 Elsevier Interactive Patient Education  2017 [employer]

## 2016-11-23 LAB — GC/CHLAMYDIA PROBE AMP
CHLAMYDIA, DNA PROBE: NEGATIVE
NEISSERIA GONORRHOEAE BY PCR: NEGATIVE

## 2016-11-24 LAB — CBC WITH DIFFERENTIAL/PLATELET
BASOS ABS: 0 10*3/uL (ref 0.0–0.2)
BASOS: 0 %
EOS (ABSOLUTE): 0.1 10*3/uL (ref 0.0–0.4)
Eos: 1 %
Hematocrit: 35.7 % (ref 34.0–46.6)
Hemoglobin: 12.2 g/dL (ref 11.1–15.9)
IMMATURE GRANULOCYTES: 0 %
Immature Grans (Abs): 0 10*3/uL (ref 0.0–0.1)
Lymphocytes Absolute: 1.2 10*3/uL (ref 0.7–3.1)
Lymphs: 15 %
MCH: 33.2 pg — AB (ref 26.6–33.0)
MCHC: 34.2 g/dL (ref 31.5–35.7)
MCV: 97 fL (ref 79–97)
MONOS ABS: 0.7 10*3/uL (ref 0.1–0.9)
Monocytes: 9 %
NEUTROS ABS: 6.3 10*3/uL (ref 1.4–7.0)
NEUTROS PCT: 75 %
PLATELETS: 206 10*3/uL (ref 150–379)
RBC: 3.67 x10E6/uL — ABNORMAL LOW (ref 3.77–5.28)
RDW: 12.4 % (ref 12.3–15.4)
WBC: 8.4 10*3/uL (ref 3.4–10.8)

## 2016-11-24 LAB — HEMOGLOBIN A1C
Est. average glucose Bld gHb Est-mCnc: 77 mg/dL
Hgb A1c MFr Bld: 4.3 % — ABNORMAL LOW (ref 4.8–5.6)

## 2016-11-24 LAB — RPR: RPR: NONREACTIVE

## 2016-11-24 LAB — VARICELLA ZOSTER ANTIBODY, IGG: VARICELLA: 2707 {index} (ref >165)

## 2016-11-24 LAB — RUBELLA SCREEN: RUBELLA: 4.97 {index} (ref >0.99)

## 2016-11-24 LAB — ANTIBODY SCREEN: ANTIBODY SCREEN: NEGATIVE

## 2016-11-24 LAB — ABO

## 2016-11-24 LAB — RH TYPE: Rh Factor: POSITIVE

## 2016-11-24 LAB — TSH: TSH: 1.95 u[IU]/mL (ref 0.450–4.500)

## 2016-11-24 LAB — URINE CULTURE, OB REFLEX

## 2016-11-24 LAB — CULTURE, OB URINE

## 2016-11-24 LAB — HEPATITIS B SURFACE ANTIGEN: Hepatitis B Surface Ag: NEGATIVE

## 2016-11-24 LAB — HEPATITIS C ANTIBODY

## 2016-11-24 LAB — SICKLE CELL SCREEN: SICKLE CELL SCREEN: NEGATIVE

## 2016-11-24 LAB — HIV ANTIBODY (ROUTINE TESTING W REFLEX): HIV Screen 4th Generation wRfx: NONREACTIVE

## 2016-11-25 NOTE — Progress Notes (Signed)
NEW OB HISTORY AND PHYSICAL  SUBJECTIVE:       Belinda Gray is a 26 y.o. [id number] female, Patient's last menstrual period was 07/26/2016 (exact date)., Estimated Date of Delivery: 05/02/17, [id number], presents today for establishment of Prenatal Care.  She reports breast tenderness and round ligament pain.   Confirmation of pregnancy in [place] on 09/20/2016.   Denies difficulty breathing or respiratory distress, chest pain, abdominal pain, dysuria, vaginal bleeding, and leg pain or swelling.    Gynecologic History  Patient's last menstrual period was 07/26/2016 (exact date).   Contraception: none  Last Pap: 2016. Results were: normal  Obstetric History  OB History  Gravida Para Term Preterm AB Living  2       1    SAB TAB Ectopic Multiple Live Births  1            # Outcome Date GA Lbr Len/2nd Weight Sex Delivery Anes PTL Lv  2 Current           1 SAB 07/2016        ND      Past Medical History:  Diagnosis Date  . Depression   . Miscarriage 07/2016    Past Surgical History:  Procedure Laterality Date  . NONE      Current Outpatient Prescriptions on File Prior to Visit  Medication Sig Dispense Refill  . ketorolac (TORADOL) 10 MG tablet Take 1 tablet (10 mg total) by mouth every 8 (eight) hours as needed for severe pain. (Patient not taking: Reported on 11/22/2016) 20 tablet 0   No current facility-administered medications on file prior to visit.     No Known Allergies  Social History   Social History  . Marital status: Single    Spouse name: N/A  . Number of children: N/A  . Years of education: N/A   Occupational History  . Not on file.   Social History Main Topics  . Smoking status: Former Smoker    Packs/day: 0.50    Types: Cigarettes    Quit date: 08/2016  . Smokeless tobacco: Never Used  . Alcohol use Yes     Comment: occasional   . Drug use: Yes    Types: Marijuana     Comment: former - quit 08/2016  . Sexual activity: Yes    Birth  control/ protection: None   Other Topics Concern  . Not on file   Social History Narrative  . No narrative on file    Family History  Problem Relation Age of Onset  . Rheum arthritis Maternal Grandmother   . Hyperlipidemia Maternal Grandfather     The following portions of the patient's history were reviewed and updated as appropriate: allergies, current medications, past OB history, past medical history, past surgical history, past family history, past social history, and problem list.  OBJECTIVE:  Initial Physical Exam (New OB)  GENERAL APPEARANCE: alert, well appearing, in no apparent distress  HEAD: normocephalic, atraumatic  MOUTH: mucous membranes moist, pharynx normal without lesions  THYROID: no thyromegaly or masses present  BREASTS: not examined  LUNGS: clear to auscultation, no wheezes, rales or rhonchi, symmetric air entry  HEART: regular rate and rhythm, no murmurs  ABDOMEN: soft, nontender, nondistended, no abnormal masses, no epigastric pain, fundus soft, nontender 17 weeks size and FHT present=156 bpm  EXTREMITIES: no redness or tenderness in the calves or thighs, no edema  SKIN: normal coloration and turgor, no rashes; unprofessional tattoos present  LYMPH NODES: no adenopathy palpable  NEUROLOGIC: alert, oriented, normal speech, no focal findings or movement disorder noted  PELVIC EXAM: not examined  ASSESSMENT: Normal pregnancy Late entry prenatal care BMI>30 Unprofessional tattoos  PLAN: Prenatal care New OB labs today Reviewed red flag symptoms and when to call RTC x 3-4 weeks for anatomy scan and ROB See orders   Gunnar Bulla, CNM

## 2016-11-26 LAB — URINALYSIS, ROUTINE W REFLEX MICROSCOPIC
BILIRUBIN UA: NEGATIVE
GLUCOSE, UA: NEGATIVE
Leukocytes, UA: NEGATIVE
NITRITE UA: NEGATIVE
RBC UA: NEGATIVE
Specific Gravity, UA: 1.028 (ref 1.005–1.030)
Urobilinogen, Ur: 1 mg/dL (ref 0.2–1.0)
pH, UA: 7.5 (ref 5.0–7.5)

## 2016-11-26 LAB — MONITOR DRUG PROFILE 14(MW)
AMPHETAMINE SCREEN URINE: NEGATIVE ng/mL
BARBITURATE SCREEN URINE: NEGATIVE ng/mL
BENZODIAZEPINE SCREEN, URINE: NEGATIVE ng/mL
BUPRENORPHINE, URINE: NEGATIVE ng/mL
COCAINE(METAB.)SCREEN, URINE: NEGATIVE ng/mL
Creatinine(Crt), U: 333.1 mg/dL — ABNORMAL HIGH (ref 20.0–300.0)
Fentanyl, Urine: NEGATIVE pg/mL
MEPERIDINE SCREEN, URINE: NEGATIVE ng/mL
Methadone Screen, Urine: NEGATIVE ng/mL
OXYCODONE+OXYMORPHONE UR QL SCN: NEGATIVE ng/mL
Opiate Scrn, Ur: NEGATIVE ng/mL
PROPOXYPHENE SCREEN URINE: NEGATIVE ng/mL
Ph of Urine: 7.3 (ref 4.5–8.9)
Phencyclidine Qn, Ur: NEGATIVE ng/mL
SPECIFIC GRAVITY: 1.02
Tramadol Screen, Urine: NEGATIVE ng/mL

## 2016-11-26 LAB — NICOTINE SCREEN, URINE: Cotinine Ql Scrn, Ur: NEGATIVE ng/mL

## 2016-11-26 LAB — CANNABINOID (GC/MS), URINE
CARBOXY THC UR: 97 ng/mL
Cannabinoid: POSITIVE — AB

## 2016-12-19 ENCOUNTER — Encounter: Payer: Self-pay | Admitting: Certified Nurse Midwife

## 2016-12-19 ENCOUNTER — Ambulatory Visit (INDEPENDENT_AMBULATORY_CARE_PROVIDER_SITE_OTHER): Payer: Medicaid Other | Admitting: Certified Nurse Midwife

## 2016-12-19 ENCOUNTER — Ambulatory Visit (INDEPENDENT_AMBULATORY_CARE_PROVIDER_SITE_OTHER): Payer: Medicaid Other

## 2016-12-19 VITALS — BP 96/78 | HR 77 | Wt 241.8 lb

## 2016-12-19 DIAGNOSIS — Z3689 Encounter for other specified antenatal screening: Secondary | ICD-10-CM

## 2016-12-19 DIAGNOSIS — Z3482 Encounter for supervision of other normal pregnancy, second trimester: Secondary | ICD-10-CM

## 2016-12-19 DIAGNOSIS — Z23 Encounter for immunization: Secondary | ICD-10-CM

## 2016-12-19 LAB — POCT URINALYSIS DIPSTICK
Bilirubin, UA: NEGATIVE
Blood, UA: NEGATIVE
Glucose, UA: NEGATIVE
Ketones, UA: NEGATIVE
LEUKOCYTES UA: NEGATIVE
NITRITE UA: NEGATIVE
PROTEIN UA: NEGATIVE
Spec Grav, UA: 1.01 (ref 1.010–1.025)
UROBILINOGEN UA: 0.2 U/dL
pH, UA: 6 (ref 5.0–8.0)

## 2016-12-19 NOTE — Progress Notes (Signed)
Belinda Gray, doing well . Reviewed round ligament pain and encouraged use of belly band. She is feeling fetal movements. Denies contractions. PTL precautions reviewed. Anatomy scan today normal and complete. Follow up 4 wks.  Doreene Burke, CNM   ULTRASOUND REPORT  Location: [facility] Date of Service: 12/19/16  Indications: Anatomy Findings:  Singleton intrauterine pregnancy is visualized with FHR at 147 BPM. Biometrics give an (U/S) Gestational age of [redacted] weeks 4 days, and an (U/S) EDD of 05/04/17; this correlates with the clinically established EDD of 05/02/17.  Fetal presentation is vertex, spine variable.  EFW: 385 grams ( 0 lbs. 14 oz.) Placenta: Posterior, grade 1 and remote to cervix at 5.5 cm with a central cord insert. AFI: Subjectively adequate with an MVP of 4.6 cm  Anatomic survey is complete and appears WNL. Gender - Female.   Right and left ovaries were not visualized. There is no evidence of a corpus luteal cyst. Survey of the adnexa demonstrates no adnexal masses. There is no free peritoneal fluid in the cul de sac.  Impression: 1. 20 week 4 day Viable Singleton Intrauterine pregnancy by U/S. 2. (U/S) EDD is consistent with Clinically established (LMP) EDD of 05/02/17. 3. Normal appearing anatomy scan.  Recommendations: 1.Clinical correlation with the patient's History and Physical Exam.  Revonda Humphrey, RDMS, RVT

## 2016-12-19 NOTE — Patient Instructions (Signed)

## 2017-01-15 ENCOUNTER — Ambulatory Visit (INDEPENDENT_AMBULATORY_CARE_PROVIDER_SITE_OTHER): Payer: Medicaid Other | Admitting: Certified Nurse Midwife

## 2017-01-15 VITALS — BP 117/75 | HR 101 | Wt 241.1 lb

## 2017-01-15 DIAGNOSIS — Z131 Encounter for screening for diabetes mellitus: Secondary | ICD-10-CM

## 2017-01-15 DIAGNOSIS — Z3492 Encounter for supervision of normal pregnancy, unspecified, second trimester: Secondary | ICD-10-CM

## 2017-01-15 DIAGNOSIS — Z130 Encounter for screening for diseases of the blood and blood-forming organs and certain disorders involving the immune mechanism: Secondary | ICD-10-CM

## 2017-01-15 NOTE — Progress Notes (Signed)
ROB- pt is doing well 

## 2017-01-15 NOTE — Progress Notes (Signed)
ROB-Pt doing well, no questions or concerns. Discussed round ligament pain and home treatment measures including use of abdominal support. Anticipatory guidance regarding 28 week labs and course of prenatal care. Reviewed flag symptoms and when to call. RTC x 4 weeks for glucola, CBC, TDaP, and ROB with Pattricia BossAnnie.

## 2017-01-15 NOTE — Patient Instructions (Signed)

## 2017-01-31 ENCOUNTER — Telehealth: Payer: Self-pay | Admitting: Certified Nurse Midwife

## 2017-01-31 NOTE — Telephone Encounter (Signed)
Has seen tried the abdominal support that we discussed at her last visit? May take 1000 mg of Tylenol and she if that help as long as she is not having any vaginal bleeding or contractions. If the discomfort and pressure persist, then she may come to the office or go to birthing suites for evaluation. Thanks, JML

## 2017-01-31 NOTE — Telephone Encounter (Signed)
Marcelino DusterMichelle  Please advise-  Pt called in concerned about Pelvic pain that has been almost every morning, but today was worse, states "feels like something is pushing" Pain level is a 5-6. States she just has pelvic discomfort while sitting but once she stands up is when she experiencing the pain, does not matter if she has been sitting for a long or short period.   Denies: Dysuria,contractions,fever,chills,nausea or vomiting,diarrhea, has not taken any medication.

## 2017-01-31 NOTE — Telephone Encounter (Signed)
Spoke with pt and informed her of Michelle's message. Pt states she will tryy some tylenol. She had no additional questions at this time. Nothing further is needed

## 2017-01-31 NOTE — Telephone Encounter (Signed)
Patient left a voice message for someone to call her about the pain in her pelvic area she is 27 weeks

## 2017-02-13 ENCOUNTER — Encounter: Payer: Self-pay | Admitting: Certified Nurse Midwife

## 2017-02-13 ENCOUNTER — Other Ambulatory Visit: Payer: Medicaid Other

## 2017-02-13 ENCOUNTER — Ambulatory Visit (INDEPENDENT_AMBULATORY_CARE_PROVIDER_SITE_OTHER): Payer: Medicaid Other | Admitting: Certified Nurse Midwife

## 2017-02-13 VITALS — BP 117/74 | HR 71 | Wt 246.0 lb

## 2017-02-13 DIAGNOSIS — Z131 Encounter for screening for diabetes mellitus: Secondary | ICD-10-CM

## 2017-02-13 DIAGNOSIS — Z23 Encounter for immunization: Secondary | ICD-10-CM | POA: Diagnosis not present

## 2017-02-13 DIAGNOSIS — Z3492 Encounter for supervision of normal pregnancy, unspecified, second trimester: Secondary | ICD-10-CM

## 2017-02-13 DIAGNOSIS — Z130 Encounter for screening for diseases of the blood and blood-forming organs and certain disorders involving the immune mechanism: Secondary | ICD-10-CM

## 2017-02-13 DIAGNOSIS — Z113 Encounter for screening for infections with a predominantly sexual mode of transmission: Secondary | ICD-10-CM

## 2017-02-13 LAB — POCT URINALYSIS DIPSTICK
Bilirubin, UA: NEGATIVE
Glucose, UA: NEGATIVE
Ketones, UA: NEGATIVE
LEUKOCYTES UA: NEGATIVE
NITRITE UA: NEGATIVE
Protein, UA: NEGATIVE
RBC UA: NEGATIVE
SPEC GRAV UA: 1.015 (ref 1.010–1.025)
UROBILINOGEN UA: 0.2 U/dL
pH, UA: 6 (ref 5.0–8.0)

## 2017-02-13 NOTE — Progress Notes (Signed)
ROB, doing well. Reviewed cord blood donation ( public & private). Discussed birth control after baby (booklet given). She states that she will more than likely do family planning ( calender method) that is what she did prior to pregnancy. BTC/Tdap/1 hr gtt/cbc/RPR today. She verbalizes understanding and agrees to plan. Follow up in 2 wks.   Doreene BurkeAnnie Caymen Dubray, CNM

## 2017-02-13 NOTE — Patient Instructions (Addendum)
Td Vaccine (Tetanus and Diphtheria): What You Need to Know 1. Why get vaccinated? Tetanus  and diphtheria are very serious diseases. They are rare in the [place] today, but people who do become infected often have severe complications. Td vaccine is used to protect adolescents and adults from both of these diseases. Both tetanus and diphtheria are infections caused by bacteria. Diphtheria spreads from person to person through coughing or sneezing. Tetanus-causing bacteria enter the body through cuts, scratches, or wounds. TETANUS (lockjaw) causes painful muscle tightening and stiffness, usually all over the body.  It can lead to tightening of muscles in the head and neck so you can't open your mouth, swallow, or sometimes even breathe. Tetanus kills about 1 out of every 10 people who are infected even after receiving the best medical care. DIPHTHERIA can cause a thick coating to form in the back of the throat.  It can lead to breathing problems, paralysis, heart failure, and death. Before vaccines, as many as 200,000 cases of diphtheria and hundreds of cases of tetanus were reported in the [place] each year. Since vaccination began, reports of cases for both diseases have dropped by about 99%. 2. Td vaccine Td vaccine can protect adolescents and adults from tetanus and diphtheria. Td is usually given as a booster dose every 10 years but it can also be given earlier after a severe and dirty wound or burn. Another vaccine, called Tdap, which protects against pertussis in addition to tetanus and diphtheria, is sometimes recommended instead of Td vaccine. Your doctor or the person giving you the vaccine can give you more information. Td may safely be given at the same time as other vaccines. 3. Some people should not get this vaccine  A person who has ever had a life-threatening allergic reaction after a previous dose of any tetanus or diphtheria containing vaccine, OR has a severe allergy  to any part of this vaccine, should not get Td vaccine. Tell the person giving the vaccine about any severe allergies.  Talk to your doctor if you:  had severe pain or swelling after any vaccine containing diphtheria or tetanus,  ever had a condition called Guillain Barre Syndrome (GBS),  aren't feeling well on the day the shot is scheduled. 4. What are the risks from Td vaccine? With any medicine, including vaccines, there is a chance of side effects. These are usually mild and go away on their own. Serious reactions are also possible but are rare. Most people who get Td vaccine do not have any problems with it. Mild problems following Td vaccine: (Did not interfere with activities)  Pain where the shot was given (about 8 people in 10)  Redness or swelling where the shot was given (about 1 person in 4)  Mild fever (rare)  Headache (about 1 person in 4)  Tiredness (about 1 person in 4) Moderate problems following Td vaccine: (Interfered with activities, but did not require medical attention)  Fever over 102F (rare) Severe problems following Td vaccine: (Unable to perform usual activities; required medical attention)  Swelling, severe pain, bleeding and/or redness in the arm where the shot was given (rare). Problems that could happen after any vaccine:  People sometimes faint after a medical procedure, including vaccination. Sitting or lying down for about 15 minutes can help prevent fainting, and injuries caused by a fall. Tell your doctor if you feel dizzy, or have vision changes or ringing in the ears.  Some people get severe pain in the shoulder   and have difficulty moving the arm where a shot was given. This happens very rarely.  Any medication can cause a severe allergic reaction. Such reactions from a vaccine are very rare, estimated at fewer than 1 in a million doses, and would happen within a few minutes to a few hours after the vaccination. As with any medicine, there  is a very remote chance of a vaccine causing a serious injury or death. The safety of vaccines is always being monitored. For more information, visit: www.cdc.gov/vaccinesafety/ 5. What if there is a serious reaction? What should I look for? Look for anything that concerns you, such as signs of a severe allergic reaction, very high fever, or unusual behavior. Signs of a severe allergic reaction can include hives, swelling of the face and throat, difficulty breathing, a fast heartbeat, dizziness, and weakness. These would usually start a few minutes to a few hours after the vaccination. What should I do?  If you think it is a severe allergic reaction or other emergency that can't wait, call 9-1-1 or get the person to the nearest hospital. Otherwise, call your doctor.  Afterward, the reaction should be reported to the Vaccine Adverse Event Reporting System (VAERS). Your doctor might file this report, or you can do it yourself through the VAERS web site at www.vaers.hhs.gov, or by calling 1-800-822-7967.  VAERS does not give medical advice. 6. The National Vaccine Injury Compensation Program The National Vaccine Injury Compensation Program (VICP) is a federal program that was created to compensate people who may have been injured by certain vaccines. Persons who believe they may have been injured by a vaccine can learn about the program and about filing a claim by calling 1-800-338-2382 or visiting the VICP website at www.hrsa.gov/vaccinecompensation. There is a time limit to file a claim for compensation. 7. How can I learn more?  Ask your doctor. He or she can give you the vaccine package insert or suggest other sources of information.  Call your local or state health department.  Contact the Centers for Disease Control and Prevention (CDC):  Call 1-800-232-4636 (1-800-CDC-INFO)  Visit CDC's website at www.cdc.gov/vaccines CDC Td Vaccine VIS (06/28/15) This information is not intended to  replace advice given to you by your health care provider. Make sure you discuss any questions you have with your health care provider. Document Released: 12/31/2005 Document Revised: 11/24/2015 Document Reviewed: 11/24/2015 Elsevier Interactive Patient Education  2017 Elsevier Inc. Tdap Vaccine (Tetanus, Diphtheria and Pertussis): What You Need to Know 1. Why get vaccinated? Tetanus, diphtheria and pertussis are very serious diseases. Tdap vaccine can protect us from these diseases. And, Tdap vaccine given to pregnant women can protect newborn babies against pertussis. TETANUS (Lockjaw) is rare in the [place] today. It causes painful muscle tightening and stiffness, usually all over the body.  It can lead to tightening of muscles in the head and neck so you can't open your mouth, swallow, or sometimes even breathe. Tetanus kills about 1 out of 10 people who are infected even after receiving the best medical care. DIPHTHERIA is also rare in the [place] today. It can cause a thick coating to form in the back of the throat.  It can lead to breathing problems, heart failure, paralysis, and death. PERTUSSIS (Whooping Cough) causes severe coughing spells, which can cause difficulty breathing, vomiting and disturbed sleep.  It can also lead to weight loss, incontinence, and rib fractures. Up to 2 in 100 adolescents and 5 in 100 adults with pertussis   are hospitalized or have complications, which could include pneumonia or death. These diseases are caused by bacteria. Diphtheria and pertussis are spread from person to person through secretions from coughing or sneezing. Tetanus enters the body through cuts, scratches, or wounds. Before vaccines, as many as 200,000 cases of diphtheria, 200,000 cases of pertussis, and hundreds of cases of tetanus, were reported in the [place] each year. Since vaccination began, reports of cases for tetanus and diphtheria have dropped by about 99% and for  pertussis by about 80%. 2. Tdap vaccine Tdap vaccine can protect adolescents and adults from tetanus, diphtheria, and pertussis. One dose of Tdap is routinely given at age 11 or 12. People who did not get Tdap at that age should get it as soon as possible. Tdap is especially important for healthcare professionals and anyone having close contact with a baby younger than 12 months. Pregnant women should get a dose of Tdap during every pregnancy, to protect the newborn from pertussis. Infants are most at risk for severe, life-threatening complications from pertussis. Another vaccine, called Td, protects against tetanus and diphtheria, but not pertussis. A Td booster should be given every 10 years. Tdap may be given as one of these boosters if you have never gotten Tdap before. Tdap may also be given after a severe cut or burn to prevent tetanus infection. Your doctor or the person giving you the vaccine can give you more information. Tdap may safely be given at the same time as other vaccines. 3. Some people should not get this vaccine  A person who has ever had a life-threatening allergic reaction after a previous dose of any diphtheria, tetanus or pertussis containing vaccine, OR has a severe allergy to any part of this vaccine, should not get Tdap vaccine. Tell the person giving the vaccine about any severe allergies.  Anyone who had coma or long repeated seizures within 7 days after a childhood dose of DTP or DTaP, or a previous dose of Tdap, should not get Tdap, unless a cause other than the vaccine was found. They can still get Td.  Talk to your doctor if you:  have seizures or another nervous system problem,  had severe pain or swelling after any vaccine containing diphtheria, tetanus or pertussis,  ever had a condition called Guillain-Barr Syndrome (GBS),  aren't feeling well on the day the shot is scheduled. 4. Risks With any medicine, including vaccines, there is a chance of side  effects. These are usually mild and go away on their own. Serious reactions are also possible but are rare. Most people who get Tdap vaccine do not have any problems with it. Mild problems following Tdap: (Did not interfere with activities)  Pain where the shot was given (about 3 in 4 adolescents or 2 in 3 adults)  Redness or swelling where the shot was given (about 1 person in 5)  Mild fever of at least 100.4F (up to about 1 in 25 adolescents or 1 in 100 adults)  Headache (about 3 or 4 people in 10)  Tiredness (about 1 person in 3 or 4)  Nausea, vomiting, diarrhea, stomach ache (up to 1 in 4 adolescents or 1 in 10 adults)  Chills, sore joints (about 1 person in 10)  Body aches (about 1 person in 3 or 4)  Rash, swollen glands (uncommon) Moderate problems following Tdap: (Interfered with activities, but did not require medical attention)  Pain where the shot was given (up to 1 in 5 or 6)    Redness or swelling where the shot was given (up to about 1 in 16 adolescents or 1 in 12 adults)  Fever over 102F (about 1 in 100 adolescents or 1 in 250 adults)  Headache (about 1 in 7 adolescents or 1 in 10 adults)  Nausea, vomiting, diarrhea, stomach ache (up to 1 or 3 people in 100)  Swelling of the entire arm where the shot was given (up to about 1 in 500). Severe problems following Tdap: (Unable to perform usual activities; required medical attention)  Swelling, severe pain, bleeding and redness in the arm where the shot was given (rare). Problems that could happen after any vaccine:  People sometimes faint after a medical procedure, including vaccination. Sitting or lying down for about 15 minutes can help prevent fainting, and injuries caused by a fall. Tell your doctor if you feel dizzy, or have vision changes or ringing in the ears.  Some people get severe pain in the shoulder and have difficulty moving the arm where a shot was given. This happens very rarely.  Any medication  can cause a severe allergic reaction. Such reactions from a vaccine are very rare, estimated at fewer than 1 in a million doses, and would happen within a few minutes to a few hours after the vaccination. As with any medicine, there is a very remote chance of a vaccine causing a serious injury or death. The safety of vaccines is always being monitored. For more information, visit: www.cdc.gov/vaccinesafety/ 5. What if there is a serious problem? What should I look for? Look for anything that concerns you, such as signs of a severe allergic reaction, very high fever, or unusual behavior. Signs of a severe allergic reaction can include hives, swelling of the face and throat, difficulty breathing, a fast heartbeat, dizziness, and weakness. These would usually start a few minutes to a few hours after the vaccination. What should I do?  If you think it is a severe allergic reaction or other emergency that can't wait, call 9-1-1 or get the person to the nearest hospital. Otherwise, call your doctor.  Afterward, the reaction should be reported to the Vaccine Adverse Event Reporting System (VAERS). Your doctor might file this report, or you can do it yourself through the VAERS web site at www.vaers.hhs.gov, or by calling 1-800-822-7967.  VAERS does not give medical advice. 6. The National Vaccine Injury Compensation Program The National Vaccine Injury Compensation Program (VICP) is a federal program that was created to compensate people who may have been injured by certain vaccines. Persons who believe they may have been injured by a vaccine can learn about the program and about filing a claim by calling 1-800-338-2382 or visiting the VICP website at www.hrsa.gov/vaccinecompensation. There is a time limit to file a claim for compensation. 7. How can I learn more?  Ask your doctor. He or she can give you the vaccine package insert or suggest other sources of information.  Call your local or state health  department.  Contact the Centers for Disease Control and Prevention (CDC):  Call 1-800-232-4636 (1-800-CDC-INFO) or  Visit CDC's website at www.cdc.gov/vaccines CDC Tdap Vaccine VIS (05/12/13) This information is not intended to replace advice given to you by your health care provider. Make sure you discuss any questions you have with your health care provider. Document Released: 09/04/2011 Document Revised: 11/24/2015 Document Reviewed: 11/24/2015 Elsevier Interactive Patient Education  2017 Elsevier Inc.  

## 2017-02-13 NOTE — Addendum Note (Signed)
Addended by: Jackquline DenmarkIDGEWAY, Maryl Blalock W on: 02/13/2017 09:14 AM   Modules accepted: Orders

## 2017-02-14 LAB — CBC
Hematocrit: 34.9 % (ref 34.0–46.6)
Hemoglobin: 11.4 g/dL (ref 11.1–15.9)
MCH: 32 pg (ref 26.6–33.0)
MCHC: 32.7 g/dL (ref 31.5–35.7)
MCV: 98 fL — ABNORMAL HIGH (ref 79–97)
PLATELETS: 198 10*3/uL (ref 150–379)
RBC: 3.56 x10E6/uL — ABNORMAL LOW (ref 3.77–5.28)
RDW: 13 % (ref 12.3–15.4)
WBC: 9.6 10*3/uL (ref 3.4–10.8)

## 2017-02-14 LAB — GLUCOSE, 1 HOUR GESTATIONAL: GESTATIONAL DIABETES SCREEN: 75 mg/dL (ref 65–139)

## 2017-02-14 LAB — RPR: RPR Ser Ql: NONREACTIVE

## 2017-02-15 ENCOUNTER — Observation Stay
Admission: EM | Admit: 2017-02-15 | Discharge: 2017-02-15 | Disposition: A | Discharge status: Home / Self Care | Payer: Medicaid Other | Attending: Obstetrics and Gynecology | Admitting: Obstetrics and Gynecology

## 2017-02-15 DIAGNOSIS — O368130 Decreased fetal movements, third trimester, not applicable or unspecified: Secondary | ICD-10-CM | POA: Diagnosis present

## 2017-02-15 DIAGNOSIS — Z3490 Encounter for supervision of normal pregnancy, unspecified, unspecified trimester: Secondary | ICD-10-CM

## 2017-02-15 DIAGNOSIS — Z3A32 32 weeks gestation of pregnancy: Secondary | ICD-10-CM | POA: Insufficient documentation

## 2017-02-15 DIAGNOSIS — Z0379 Encounter for other suspected maternal and fetal conditions ruled out: Secondary | ICD-10-CM | POA: Diagnosis not present

## 2017-02-15 MED ORDER — CALCIUM CARBONATE ANTACID 500 MG PO CHEW: 2.0000 | CHEWABLE_TABLET | ORAL | Status: DC | PRN

## 2017-02-15 NOTE — OB Triage Note (Signed)
Patient came in for observation for decreased fetal movement since yesterday morning and sharp lower abdominal cramping that patient rates 5/10. Patient denies uterine contractions. Patient denies leaking of fluid, denies vaginal bleeding and spotting. Vital signs stable and patient afebrile. FHR baseline 155with moderate variability with accelerations 15 x 15 and no decelerations. Friend at bedside with patient. Will continue to monitor.

## 2017-02-15 NOTE — Discharge Summary (Signed)
Patient discharged with instructions on follow up appointment, fetal kick count, and when to seek medical attention. Patient ambulatory at discharged with steady gait. Patient reports + FM at discharge. Patient discharged with friend. Patient verbalized understanding of discharge instructions.

## 2017-02-17 ENCOUNTER — Encounter: Payer: Self-pay | Admitting: Certified Nurse Midwife

## 2017-02-28 ENCOUNTER — Encounter: Payer: Medicaid Other | Admitting: Certified Nurse Midwife

## 2017-03-01 ENCOUNTER — Ambulatory Visit (INDEPENDENT_AMBULATORY_CARE_PROVIDER_SITE_OTHER): Payer: Medicaid Other | Admitting: Certified Nurse Midwife

## 2017-03-01 VITALS — BP 117/63 | HR 77 | Wt 250.4 lb

## 2017-03-01 DIAGNOSIS — Z3493 Encounter for supervision of normal pregnancy, unspecified, third trimester: Secondary | ICD-10-CM

## 2017-03-01 LAB — POCT URINALYSIS DIPSTICK
Bilirubin, UA: NEGATIVE
Glucose, UA: NEGATIVE
KETONES UA: NEGATIVE
LEUKOCYTES UA: NEGATIVE
NITRITE UA: NEGATIVE
PH UA: 7 (ref 5.0–8.0)
PROTEIN UA: NEGATIVE
RBC UA: NEGATIVE
SPEC GRAV UA: 1.01 (ref 1.010–1.025)
UROBILINOGEN UA: 0.2 U/dL

## 2017-03-01 NOTE — Progress Notes (Signed)
ROB-Doing well. Will name daughter, Belinda Gray. Plans breastfeeding. Desires NCB-would not like "pain medication" or "pain" mentioned during labor and birth. Education regarding intrapartum pain management options including continuous labor support and hydrotherapy. [facility]. [facility]. Traveling to [place] for holidays. Discussed pregnancy and travel recommendations. Conversation regarding weight gain in pregnancy and encourage exercise with healthy eating. Reviewed red flag symptoms and when to call. RTC x 2 weeks for ROB or sooner if needed.

## 2017-03-01 NOTE — Patient Instructions (Addendum)
Pregnancy and Travel Most pregnant woman can safely travel until the last month of the pregnancy. However, pregnant women with medical problems or problems with their pregnancy should limit or avoid travel. The best time to travel is between 14 and 28 weeks of the pregnancy. During this period, morning sickness should be minimal and other problems are less likely to develop. General travel tips Before you go:  Discuss your trip with your health care provider and get examined shortly before you go.  Get a copy of your medical records and be sure to take them with you.  Try to get names of doctors and hospitals in the area you will be visiting.  Pack your pillow if you can.  Get a good night's sleep the night before you make your trip.  During your trip:  Ask for locations of doctors and hospitals if you did not do this before leaving.  Wear flat, comfortable shoes.  Eat a balanced diet, drink a lot of fluids, and take your vitamins and supplements.  Do not wear yourself out.  Do not ride on a motorcycle.  Rest. If you spent a lot of time traveling, lie down for 30 or more minutes with your feet slightly raised after you reach your destination.  Tips for traveling to a foreign country Before you go:  Ask your health care provider if there are any medicines that are safe to take if you get diarrhea, constipation, nausea, or vomiting.  Make copies of your medical records in case you lose the originals.  During your trip:  Do not eat uncooked foods of any kind.  Drink bottled water and do not use ice.  Wash fruits and vegetables with hot, soapy water.  Only drink pasteurized milk.  Tips for traveling by car  Wear your seat belt properly.  If you are in the front seat, sit as far away from the dashboard as possible to avoid getting hit hard if the air bag deploys in an accident.  Stop about every 2 hours to use the restroom and walk around. This helps the circulation in  your legs.  Keep water, crackers, and fruit in the car.  Do not travel for more than 6 hours a day. Tips for traveling by bus  Before making a reservation, ask whether your bus will have a restroom.  Take water, crackers, and fruit with you.  Get out and walk around if and when the bus stops.  Move your arms and legs when seated. This helps with your circulation. Tips for traveling by train Before making a reservation, ask if your train will have a sleeping car and more than one restroom. Tips for traveling by airplane  Before booking your trip, ask about the airline's rules about pregnancy. Pregnant women may be restricted from [place] after a certain time of the pregnancy. Every airline has its own rules and regulations.  Ask whether the airplane cabin will be pressurized. Do not board an unpressurized plane that will fly above 7,000 ft (2,100 km).  Try to get a bulkhead or an aisle seat.  Wear layered clothing because the temperature in the cabin can change.  Take water, crackers, and fruit with you on the airplane.  Put all your medicines and medical records in your carry-on bag.  Avoid drinks with caffeine and do not eat a big meal.  Do not walk around the airplane to stretch your legs.  Move your arms and legs while sitting to help with your  circulation.  Wear your seat belt at all times. Tips for traveling by cruise ship  Before booking your trip, ask the following questions: ? Are pregnant women allowed on the cruise ship? ? Is there a medical facility and health care provider on board? ? Does the ship dock in cities where there are health care providers and medical facilities?  Before booking your trip, ask your health care provider if: ? It is safe for you to take medicines if you get seasick. ? It is safe for you to wear acupressure wristbands to prevent getting seasick. If your health care provider says it is safe, consider purchasing one. This information is  not intended to replace advice given to you by your health care provider. Make sure you discuss any questions you have with your health care provider. Document Released: 02/16/2008 Document Revised: 08/11/2015 Document Reviewed: 01/30/2013 Elsevier Interactive Patient Education  2017 [employer] Inc[employer] Pain Relief During Labor and Delivery Many things can cause pain during labor and delivery, including:  Pressure on bones and ligaments due to the baby moving through the pelvis.  Stretching of tissues due to the baby moving through the birth canal.  Muscle tension due to anxiety or nervousness.  The uterus tightening (contracting) and relaxing to help move the baby.  There are many ways to deal with the pain of labor and delivery. They include:  Taking prenatal classes. Taking these classes helps you know what to expect during your baby's birth. What you learn will increase your confidence and decrease your anxiety.  Practicing relaxation techniques or doing relaxing activities, such as: ? Focused breathing. ? Meditation. ? Visualization. ? Aroma therapy. ? Listening to your favorite music. ? Hypnosis.  Taking a warm shower or bath (hydrotherapy). This may: ? Provide comfort and relaxation. ? Lessen your perception of pain. ? Decrease the amount of pain medicine needed. ? Decrease the length of labor.  Getting a massage or counterpressure on your back.  Applying warm packs or ice packs.  Changing positions often, moving around, or using a birthing ball.  Getting: ? Pain medicine through an IV or injection into a muscle. ? Pain medicine inserted into your spinal column. ? Injections of sterile water just under the skin on your lower back (intradermal injections). ? Laughing gas (nitrous oxide).  Discuss your pain control options with your health care provider during your prenatal visits. Explore the options offered by your hospital or birth center. What kinds of medicine are  available? There are two kinds of medicines that can be used to relieve pain during labor and delivery:  Analgesics. These medicines decrease pain without causing you to lose feeling or the ability to move your muscles.  Anesthetics. These medicines block feeling in the body and can decrease your ability to move freely.  Both of these kinds of medicine can cause minor side effects, such as nausea, trouble concentrating, and sleepiness. They can also decrease the baby's heart rate before birth and affect the baby's breathing rate after birth. For this reason, health care providers are careful about when and how much medicine is given. What are specific medicines and procedures that provide pain relief? Local Anesthetics Local anesthetics are used to numb a small area of the body. They may be used along with another kind of anesthetic or used to numb the nerves of the vagina, cervix, and perineum during the second stage of labor. General Anesthetics General anesthetics cause you to lose consciousness so you do not feel  pain. They are usually only used for an emergency cesarean delivery. General anesthetics are given through an IV tube and a mask. Pudendal Block A pudendal block is a form of local anesthetic. It may be used to relieve the pain associated with pushing or stretching of the perineum at the time of delivery or to further numb the perineum. A pudendal block is done by injecting numbing medicine through the vaginal wall into a nerve in the pelvis. Epidural Analgesia Epidural analgesia is given through a flexible IV catheter that is inserted into the lower back. Numbing medicine is delivered continuously to the area near your spinal column nerves (epidural space). After having this type of analgesia, you may be able to move your legs but you most likely will not be able to walk. Depending on the amount of medicine given, you may lose all feeling in the lower half of your body, or you may  retain some level of sensation, including the urge to push. Epidural analgesia can be used to provide pain relief for a vaginal birth. Spinal Block A spinal block is similar to epidural analgesia, but the medicine is injected into the spinal fluid instead of the epidural space. A spinal block is only given once. It starts to relieve pain quickly, but the pain relief lasts only 1-6 hours. Spinal blocks can be used for cesarean deliveries. Combined Spinal-Epidural (CSE) Block A CSE block combines the effects of a spinal block and epidural analgesia. The spinal block works quickly to block all pain. The epidural analgesia provides continuous pain relief, even after the effects of the spinal block have worn off. This information is not intended to replace advice given to you by your health care provider. Make sure you discuss any questions you have with your health care provider. Document Released: 06/21/2008 Document Revised: 08/12/2015 Document Reviewed: 07/27/2015 Elsevier Interactive Patient Education  2018 [employer]  Eating Plan for Pregnant Women While you are pregnant, your body will require additional nutrition to help support your growing baby. It is recommended that you consume:  150 additional calories each day during your first trimester.  300 additional calories each day during your second trimester.  300 additional calories each day during your third trimester.  Eating a healthy, well-balanced diet is very important for your health and for your baby's health. You also have a higher need for some vitamins and minerals, such as folic acid, calcium, iron, and vitamin D. What do I need to know about eating during pregnancy?  Do not try to lose weight or go on a diet during pregnancy.  Choose healthy, nutritious foods. Choose  of a sandwich with a glass of milk instead of a candy bar or a high-calorie sugar-sweetened beverage.  Limit your overall intake of foods that have "empty  calories." These are foods that have little nutritional value, such as sweets, desserts, candies, sugar-sweetened beverages, and fried foods.  Eat a variety of foods, especially fruits and vegetables.  Take a prenatal vitamin to help meet the additional needs during pregnancy, specifically for folic acid, iron, calcium, and vitamin D.  Remember to stay active. Ask your health care provider for exercise recommendations that are specific to you.  Practice good food safety and cleanliness, such as washing your hands before you eat and after you prepare raw meat. This helps to prevent foodborne illnesses, such as listeriosis, that can be very dangerous for your baby. Ask your health care provider for more information about listeriosis. What does 150  extra calories look like? Healthy options for an additional 150 calories each day could be any of the following:  Plain low-fat yogurt (6-8 oz) with  cup of berries.  1 apple with 2 teaspoons of peanut butter.  Cut-up vegetables with  cup of hummus.  Low-fat chocolate milk (8 oz or 1 cup).  1 string cheese with 1 medium orange.   of a peanut butter and jelly sandwich on whole-wheat bread (1 tsp of peanut butter).  For 300 calories, you could eat two of those healthy options each day. What is a healthy amount of weight to gain? The recommended amount of weight for you to gain is based on your pre-pregnancy BMI. If your pre-pregnancy BMI was:  Less than 18 (underweight), you should gain 28-40 lb.  18-24.9 (normal), you should gain 25-35 lb.  25-29.9 (overweight), you should gain 15-25 lb.  Greater than 30 (obese), you should gain 11-20 lb.  What if I am having twins or multiples? Generally, pregnant women who will be having twins or multiples may need to increase their daily calories by 300-600 calories each day. The recommended range for total weight gain is 25-54 lb, depending on your pre-pregnancy BMI. Talk with your health care  provider for specific guidance about additional nutritional needs, weight gain, and exercise during your pregnancy. What foods can I eat? Grains Any grains. Try to choose whole grains, such as whole-wheat bread, oatmeal, or brown rice. Vegetables Any vegetables. Try to eat a variety of colors and types of vegetables to get a full range of vitamins and minerals. Remember to wash your vegetables well before eating. Fruits Any fruits. Try to eat a variety of colors and types of fruit to get a full range of vitamins and minerals. Remember to wash your fruits well before eating. Meats and Other Protein Sources Lean meats, including chicken, [place], fish, and lean cuts of beef, veal, or pork. Make sure that all meats are cooked to "well done." Tofu. Tempeh. Beans. Eggs. Peanut butter and other nut butters. Seafood, such as shrimp, crab, and lobster. If you choose fish, select types that are higher in omega-3 fatty acids, including salmon, herring, mussels, trout, sardines, and pollock. Make sure that all meats are cooked to food-safe temperatures. Dairy Pasteurized milk and milk alternatives. Pasteurized yogurt and pasteurized cheese. Cottage cheese. Sour cream. Beverages Water. Juices that contain 100% fruit juice or vegetable juice. Caffeine-free teas and decaffeinated coffee. Drinks that contain caffeine are okay to drink, but it is better to avoid caffeine. Keep your total caffeine intake to less than 200 mg each day (12 oz of coffee, tea, or soda) or as directed by your health care provider. Condiments Any pasteurized condiments. Sweets and Desserts Any sweets and desserts. Fats and Oils Any fats and oils. The items listed above may not be a complete list of recommended foods or beverages. Contact your dietitian for more options. What foods are not recommended? Vegetables Unpasteurized (raw) vegetable juices. Fruits Unpasteurized (raw) fruit juices. Meats and Other Protein Sources Cured  meats that have nitrates, such as bacon, salami, and hotdogs. Luncheon meats, bologna, or other deli meats (unless they are reheated until they are steaming hot). Refrigerated pate, meat spreads from a meat counter, smoked seafood that is found in the refrigerated section of a store. Raw fish, such as sushi or sashimi. High mercury content fish, such as tilefish, shark, swordfish, and king mackerel. Raw meats, such as tuna or beef tartare. Undercooked meats and poultry. Make sure  that all meats are cooked to food-safe temperatures. Dairy Unpasteurized (raw) milk and any foods that have raw milk in them. Soft cheeses, such as feta, queso blanco, queso fresco, Brie, Camembert cheeses, blue-veined cheeses, and Panela cheese (unless it is made with pasteurized milk, which must be stated on the label). Beverages Alcohol. Sugar-sweetened beverages, such as sodas, teas, or energy drinks. Condiments Homemade fermented foods and drinks, such as pickles, sauerkraut, or kombucha drinks. (Store-bought pasteurized versions of these are okay.) Other Salads that are made in the store, such as ham salad, chicken salad, egg salad, tuna salad, and seafood salad. The items listed above may not be a complete list of foods and beverages to avoid. Contact your dietitian for more information. This information is not intended to replace advice given to you by your health care provider. Make sure you discuss any questions you have with your health care provider. Document Released: 12/18/2013 Document Revised: 08/11/2015 Document Reviewed: 08/18/2013 Elsevier Interactive Patient Education  [employer].

## 2017-03-01 NOTE — Progress Notes (Signed)
ROB- pt is doing well 

## 2017-03-08 NOTE — OB Triage Note (Signed)
L&D OB Triage Note  SUBJECTIVE Belinda Gray is a 26 y.o. G2P0010 female at 1315w1d, EDD Estimated Date of Delivery: 05/02/17 who presented to triage with complaints of decreased fetal movement.   Obstetric History   G2   P0   T0   P0   A1   L0    SAB1   TAB0   Ectopic0   Multiple0   Live Births0     # Outcome Date GA Lbr Len/2nd Weight Sex Delivery Anes PTL Lv  2 Current           1 SAB 07/2016        ND      No medications prior to admission.     OBJECTIVE  Nursing Evaluation:   BP 126/65 (BP Location: Left Arm)   Pulse 88   Temp 98.9 F (37.2 C) (Oral)   Resp 18   Ht 5\' 6"  (1.676 m)   Wt 246 lb (111.6 kg)   LMP 07/26/2016 (Exact Date)   BMI 39.71 kg/m    Findings:  Reactive   NST was performed and has been reviewed by me.  NST INTERPRETATION: Category I  Mode: External Baseline Rate (A): 155 bpm Variability: Moderate Accelerations: 15 x 15 Decelerations: None     Contraction Frequency (min): none  ASSESSMENT Impression:  1.  Pregnancy:  G2P0010 at 715w1d , EDD Estimated Date of Delivery: 05/02/17 2.  NST:  reactive  PLAN 1. Reassurance given, kick counts reviewed with pt by RN  2. Discharge home with standard labor precautions given to return to L&D or call the office for problems.  3. Continue routine prenatal care.  Doreene BurkeAnnie Briante Loveall, CNM

## 2017-03-15 ENCOUNTER — Encounter: Payer: Medicaid Other | Admitting: Certified Nurse Midwife

## 2017-03-19 NOTE — L&D Delivery Note (Signed)
0200 In room to see patient after epidural placement followed by decelerations. SVE: 10/100/0, vertex. Unable to push due to adequate pain relief. Epidural rate decreased per patient request.   0248 Effective maternal pushing efforts noted with contractions. [id number] Dr Logan BoresEvans notified of fetal heart rate decelerations with pushing, possible need for assisted birth. [id number] Dr. Logan BoresEvans [facility]. Advised to proceed with maternal pushing efforts.   [id number] Spontaneous vaginal birth of liveborn female patient in vertex presentation, left occiput anterior position over intact perineum. Infant immediately to maternal abdomen. Delayed cord clamping. Three (3) vessel cord: APGARS: 8, 9. Weight: 5 pounds 4 ounces. Receiving nurse and NNP present at bedside for birth. Pitocin infusing.   [id number]: Spontaneous delivery of intact placenta. Anesthesia: epidural. EBL: 350 ml. Left labial laceration hemostatic, unrepaired. Vault check completed. Counts correct x 2.   Initiate routine postpartum care and orders. Mom to postpartum.  Baby to Couplet care / Skin to Skin.  FOB and family members present at bedside for birth of Belinda Gray.    Gunnar BullaJenkins Michelle Canaan Prue, CNM [facility] 04/26/2017, 4:00 AM

## 2017-03-22 ENCOUNTER — Ambulatory Visit (INDEPENDENT_AMBULATORY_CARE_PROVIDER_SITE_OTHER): Payer: Medicaid Other | Admitting: Certified Nurse Midwife

## 2017-03-22 ENCOUNTER — Encounter: Payer: Self-pay | Admitting: Certified Nurse Midwife

## 2017-03-22 VITALS — BP 124/82 | HR 78 | Wt 255.4 lb

## 2017-03-22 DIAGNOSIS — Z3493 Encounter for supervision of normal pregnancy, unspecified, third trimester: Secondary | ICD-10-CM

## 2017-03-22 LAB — POCT URINALYSIS DIPSTICK
BILIRUBIN UA: NEGATIVE
GLUCOSE UA: NEGATIVE
Ketones, UA: NEGATIVE
LEUKOCYTES UA: NEGATIVE
Nitrite, UA: NEGATIVE
RBC UA: NEGATIVE
Spec Grav, UA: >=1.03 — AB (ref 1.010–1.025)
Urobilinogen, UA: 0.2 E.U./dL
pH, UA: 6.5 (ref 5.0–8.0)

## 2017-03-22 NOTE — Patient Instructions (Signed)
Group B Streptococcus Infection During Pregnancy Group B Streptococcus (GBS) is a type of bacteria (Streptococcus agalactiae) that is often found in healthy people, commonly in the rectum, vagina, and intestines. In people who are healthy and not pregnant, the bacteria rarely cause serious illness or complications. However, women who test positive for GBS during pregnancy can pass the bacteria to their baby during childbirth, which can cause serious infection in the baby after birth. Women with GBS may also have infections during their pregnancy or immediately after childbirth, such as such as urinary tract infections (UTIs) or infections of the uterus (uterine infections). Having GBS also increases a woman's risk of complications during pregnancy, such as early (preterm) labor or delivery, miscarriage, or stillbirth. Routine testing (screening) for GBS is recommended for all pregnant women. What increases the risk? You may have a higher risk for GBS infection during pregnancy if you had one during a past pregnancy. What are the signs or symptoms? In most cases, GBS infection does not cause symptoms in pregnant women. Signs and symptoms of a possible GBS-related infection may include:  Labor starting before the 37th week of pregnancy.  A UTI or bladder infection, which may cause: ? Fever. ? Pain or burning during urination. ? Frequent urination.  Fever during labor, along with: ? Bad-smelling discharge. ? Uterine tenderness. ? Rapid heartbeat in the mother, baby, or both.  Rare but serious symptoms of a possible GBS-related infection in women include:  Blood infection (septicemia). This may cause fever, chills, or confusion.  Lung infection (pneumonia). This may cause fever, chills, cough, rapid breathing, difficulty breathing, or chest pain.  Bone, joint, skin, or soft tissue infection.  How is this diagnosed? You may be screened for GBS between week 35 and week 37 of your pregnancy. If  you have symptoms of preterm labor, you may be screened earlier. This condition is diagnosed based on lab test results from:  A swab of fluid from the vagina and rectum.  A urine sample.  How is this treated? This condition is treated with antibiotic medicine. When you go into labor, or as soon as your water breaks (your membranes rupture), you will be given antibiotics through an IV tube. Antibiotics will continue until after you give birth. If you are having a cesarean delivery, you do not need antibiotics unless your membranes have already ruptured. Follow these instructions at home:  Take over-the-counter and prescription medicines only as told by your health care provider.  Take your antibiotic medicine as told by your health care provider. Do not stop taking the antibiotic even if you start to feel better.  Keep all pre-birth (prenatal) visits and follow-up visits as told by your health care provider. This is important. Contact a health care provider if:  You have pain or burning when you urinate.  You have to urinate frequently.  You have a fever or chills.  You develop a bad-smelling vaginal discharge. Get help right away if:  Your membranes rupture.  You go into labor.  You have severe pain in your abdomen.  You have difficulty breathing.  You have chest pain. This information is not intended to replace advice given to you by your health care provider. Make sure you discuss any questions you have with your health care provider. Document Released: 06/12/2007 Document Revised: 09/30/2015 Document Reviewed: 09/29/2015 Elsevier Interactive Patient Education  2018 Elsevier Inc.  

## 2017-03-22 NOTE — Progress Notes (Signed)
ROB- Pt states she is doing well 

## 2017-03-22 NOTE — Progress Notes (Signed)
ROB, doing well. Feels good movement. Anticipatory guidance for GBS at next visit. Follow u 2 wks.   Doreene BurkeAnnie Emmersen Garraway, CNM

## 2017-04-09 ENCOUNTER — Ambulatory Visit (INDEPENDENT_AMBULATORY_CARE_PROVIDER_SITE_OTHER): Payer: Medicaid Other | Admitting: Certified Nurse Midwife

## 2017-04-09 ENCOUNTER — Encounter: Payer: Medicaid Other | Admitting: Obstetrics and Gynecology

## 2017-04-09 VITALS — BP 118/86 | HR 80 | Wt 259.1 lb

## 2017-04-09 DIAGNOSIS — Z3493 Encounter for supervision of normal pregnancy, unspecified, third trimester: Secondary | ICD-10-CM | POA: Diagnosis not present

## 2017-04-09 LAB — POCT URINALYSIS DIPSTICK
BILIRUBIN UA: NEGATIVE
Blood, UA: NEGATIVE
GLUCOSE UA: NEGATIVE
Ketones, UA: NEGATIVE
LEUKOCYTES UA: NEGATIVE
Nitrite, UA: NEGATIVE
SPEC GRAV UA: 1.025 (ref 1.010–1.025)
Urobilinogen, UA: 0.2 E.U./dL
pH, UA: 6 (ref 5.0–8.0)

## 2017-04-09 NOTE — Patient Instructions (Signed)
Vaginal Delivery Vaginal delivery means that you will give birth by pushing your baby out of your birth canal (vagina). A team of health care providers will help you before, during, and after vaginal delivery. Birth experiences are unique for every woman and every pregnancy, and birth experiences vary depending on where you choose to give birth. What should I do to prepare for my baby's birth? Before your baby is born, it is important to talk with your health care provider about:  Your labor and delivery preferences. These may include: ? Medicines that you may be given. ? How you will manage your pain. This might include non-medical pain relief techniques or injectable pain relief such as epidural analgesia. ? How you and your baby will be monitored during labor and delivery. ? Who may be in the labor and delivery room with you. ? Your feelings about surgical delivery of your baby (cesarean delivery, or C-section) if this becomes necessary. ? Your feelings about receiving donated blood through an IV tube (blood transfusion) if this becomes necessary.  Whether you are able: ? To take pictures or videos of the birth. ? To eat during labor and delivery. ? To move around, walk, or change positions during labor and delivery.  What to expect after your baby is born, such as: ? Whether delayed umbilical cord clamping and cutting is offered. ? Who will care for your baby right after birth. ? Medicines or tests that may be recommended for your baby. ? Whether breastfeeding is supported in your hospital or birth center. ? How long you will be in the hospital or birth center.  How any medical conditions you have may affect your baby or your labor and delivery experience.  To prepare for your baby's birth, you should also:  Attend all of your health care visits before delivery (prenatal visits) as recommended by your health care provider. This is important.  Prepare your home for your baby's  arrival. Make sure that you have: ? Diapers. ? Baby clothing. ? Feeding equipment. ? Safe sleeping arrangements for you and your baby.  Install a car seat in your vehicle. Have your car seat checked by a certified car seat installer to make sure that it is installed safely.  Think about who will help you with your new baby at home for at least the first several weeks after delivery.  What can I expect when I arrive at the birth center or hospital? Once you are in labor and have been admitted into the hospital or birth center, your health care provider may:  Review your pregnancy history and any concerns you have.  Insert an IV tube into one of your veins. This is used to give you fluids and medicines.  Check your blood pressure, pulse, temperature, and heart rate (vital signs).  Check whether your bag of water (amniotic sac) has broken (ruptured).  Talk with you about your birth plan and discuss pain control options.  Monitoring Your health care provider may monitor your contractions (uterine monitoring) and your baby's heart rate (fetal monitoring). You may need to be monitored:  Often, but not continuously (intermittently).  All the time or for long periods at a time (continuously). Continuous monitoring may be needed if: ? You are taking certain medicines, such as medicine to relieve pain or make your contractions stronger. ? You have pregnancy or labor complications.  Monitoring may be done by:  Placing a special stethoscope or a handheld monitoring device on your abdomen to   check your baby's heartbeat, and feeling your abdomen for contractions. This method of monitoring does not continuously record your baby's heartbeat or your contractions.  Placing monitors on your abdomen (external monitors) to record your baby's heartbeat and the frequency and length of contractions. You may not have to wear external monitors all the time.  Placing monitors inside of your uterus  (internal monitors) to record your baby's heartbeat and the frequency, length, and strength of your contractions. ? Your health care provider may use internal monitors if he or she needs more information about the strength of your contractions or your baby's heart rate. ? Internal monitors are put in place by passing a thin, flexible wire through your vagina and into your uterus. Depending on the type of monitor, it may remain in your uterus or on your baby's head until birth. ? Your health care provider will discuss the benefits and risks of internal monitoring with you and will ask for your permission before inserting the monitors.  Telemetry. This is a type of continuous monitoring that can be done with external or internal monitors. Instead of having to stay in bed, you are able to move around during telemetry. Ask your health care provider if telemetry is an option for you.  Physical exam Your health care provider may perform a physical exam. This may include:  Checking whether your baby is positioned: ? With the head toward your vagina (head-down). This is most common. ? With the head toward the top of your uterus (head-up or breech). If your baby is in a breech position, your health care provider may try to turn your baby to a head-down position so you can deliver vaginally. If it does not seem that your baby can be born vaginally, your provider may recommend surgery to deliver your baby. In rare cases, you may be able to deliver vaginally if your baby is head-up (breech delivery). ? Lying sideways (transverse). Babies that are lying sideways cannot be delivered vaginally.  Checking your cervix to determine: ? Whether it is thinning out (effacing). ? Whether it is opening up (dilating). ? How low your baby has moved into your birth canal.  What are the three stages of labor and delivery?  Normal labor and delivery is divided into the following three stages: Stage 1  Stage 1 is the  longest stage of labor, and it can last for hours or days. Stage 1 includes: ? Early labor. This is when contractions may be irregular, or regular and mild. Generally, early labor contractions are more than 10 minutes apart. ? Active labor. This is when contractions get longer, more regular, more frequent, and more intense. ? The transition phase. This is when contractions happen very close together, are very intense, and may last longer than during any other part of labor.  Contractions generally feel mild, infrequent, and irregular at first. They get stronger, more frequent (about every 2-3 minutes), and more regular as you progress from early labor through active labor and transition.  Many women progress through stage 1 naturally, but you may need help to continue making progress. If this happens, your health care provider may talk with you about: ? Rupturing your amniotic sac if it has not ruptured yet. ? Giving you medicine to help make your contractions stronger and more frequent.  Stage 1 ends when your cervix is completely dilated to 4 inches (10 cm) and completely effaced. This happens at the end of the transition phase. Stage 2  Once   your cervix is completely effaced and dilated to 4 inches (10 cm), you may start to feel an urge to push. It is common for the body to naturally take a rest before feeling the urge to push, especially if you received an epidural or certain other pain medicines. This rest period may last for up to 1-2 hours, depending on your unique labor experience.  During stage 2, contractions are generally less painful, because pushing helps relieve contraction pain. Instead of contraction pain, you may feel stretching and burning pain, especially when the widest part of your baby's head passes through the vaginal opening (crowning).  Your health care provider will closely monitor your pushing progress and your baby's progress through the vagina during stage 2.  Your  health care provider may massage the area of skin between your vaginal opening and anus (perineum) or apply warm compresses to your perineum. This helps it stretch as the baby's head starts to crown, which can help prevent perineal tearing. ? In some cases, an incision may be made in your perineum (episiotomy) to allow the baby to pass through the vaginal opening. An episiotomy helps to make the opening of the vagina larger to allow more room for the baby to fit through.  It is very important to breathe and focus so your health care provider can control the delivery of your baby's head. Your health care provider may have you decrease the intensity of your pushing, to help prevent perineal tearing.  After delivery of your baby's head, the shoulders and the rest of the body generally deliver very quickly and without difficulty.  Once your baby is delivered, the umbilical cord may be cut right away, or this may be delayed for 1-2 minutes, depending on your baby's health. This may vary among health care providers, hospitals, and birth centers.  If you and your baby are healthy enough, your baby may be placed on your chest or abdomen to help maintain the baby's temperature and to help you bond with each other. Some mothers and babies start breastfeeding at this time. Your health care team will dry your baby and help keep your baby warm during this time.  Your baby may need immediate care if he or she: ? Showed signs of distress during labor. ? Has a medical condition. ? Was born too early (prematurely). ? Had a bowel movement before birth (meconium). ? Shows signs of difficulty transitioning from being inside the uterus to being outside of the uterus. If you are planning to breastfeed, your health care team will help you begin a feeding. Stage 3  The third stage of labor starts immediately after the birth of your baby and ends after you deliver the placenta. The placenta is an organ that develops  during pregnancy to provide oxygen and nutrients to your baby in the womb.  Delivering the placenta may require some pushing, and you may have mild contractions. Breastfeeding can stimulate contractions to help you deliver the placenta.  After the placenta is delivered, your uterus should tighten (contract) and become firm. This helps to stop bleeding in your uterus. To help your uterus contract and to control bleeding, your health care provider may: ? Give you medicine by injection, through an IV tube, by mouth, or through your rectum (rectally). ? Massage your abdomen or perform a vaginal exam to remove any blood clots that are left in your uterus. ? Empty your bladder by placing a thin, flexible tube (catheter) into your bladder. ? Encourage   you to breastfeed your baby. After labor is over, you and your baby will be monitored closely to ensure that you are both healthy until you are ready to go home. Your health care team will teach you how to care for yourself and your baby. This information is not intended to replace advice given to you by your health care provider. Make sure you discuss any questions you have with your health care provider. Document Released: 12/13/2007 Document Revised: 09/23/2015 Document Reviewed: 03/20/2015 Elsevier Interactive Patient Education  2018 [employer]  

## 2017-04-09 NOTE — Progress Notes (Signed)
Pt is here for an ROB. 36 week cultures today. States her back hurts- she did too much walking yesterday.

## 2017-04-09 NOTE — Progress Notes (Signed)
ROB-Reports back and hip pain after taking a long walk yesterday. Discussed home treatment measures. 36 week cultures collected. SVE requested. Reviewed red flag symptoms and when to call. RTC x 1 week for ROB or sooner if needed.

## 2017-04-11 LAB — STREP GP B NAA: STREP GROUP B AG: NEGATIVE

## 2017-04-11 LAB — GC/CHLAMYDIA PROBE AMP
Chlamydia trachomatis, NAA: NEGATIVE
Neisseria gonorrhoeae by PCR: NEGATIVE

## 2017-04-16 ENCOUNTER — Ambulatory Visit (INDEPENDENT_AMBULATORY_CARE_PROVIDER_SITE_OTHER): Payer: Medicaid Other | Admitting: Certified Nurse Midwife

## 2017-04-16 ENCOUNTER — Encounter: Payer: Self-pay | Admitting: Certified Nurse Midwife

## 2017-04-16 VITALS — BP 115/78 | HR 71 | Wt 264.2 lb

## 2017-04-16 DIAGNOSIS — Z3493 Encounter for supervision of normal pregnancy, unspecified, third trimester: Secondary | ICD-10-CM

## 2017-04-16 LAB — POCT URINALYSIS DIPSTICK
BILIRUBIN UA: NEGATIVE
Glucose, UA: NEGATIVE
KETONES UA: NEGATIVE
Leukocytes, UA: NEGATIVE
Nitrite, UA: NEGATIVE
PH UA: 5 (ref 5.0–8.0)
Protein, UA: NEGATIVE
RBC UA: NEGATIVE
SPEC GRAV UA: 1.025 (ref 1.010–1.025)
UROBILINOGEN UA: 0.2 U/dL

## 2017-04-16 NOTE — Patient Instructions (Signed)
Braxton Hicks Contractions °Contractions of the uterus can occur throughout pregnancy, but they are not always a sign that you are in labor. You may have practice contractions called Braxton Hicks contractions. These false labor contractions are sometimes confused with true labor. °What are Braxton Hicks contractions? °Braxton Hicks contractions are tightening movements that occur in the muscles of the uterus before labor. Unlike true labor contractions, these contractions do not result in opening (dilation) and thinning of the cervix. Toward the end of pregnancy (32-34 weeks), Braxton Hicks contractions can happen more often and may become stronger. These contractions are sometimes difficult to tell apart from true labor because they can be very uncomfortable. You should not feel embarrassed if you go to the hospital with false labor. °Sometimes, the only way to tell if you are in true labor is for your health care provider to look for changes in the cervix. The health care provider will do a physical exam and may monitor your contractions. If you are not in true labor, the exam should show that your cervix is not dilating and your water has not broken. °If there are other health problems associated with your pregnancy, it is completely safe for you to be sent home with false labor. You may continue to have Braxton Hicks contractions until you go into true labor. °How to tell the difference between true labor and false labor °True labor °· Contractions last 30-70 seconds. °· Contractions become very regular. °· Discomfort is usually felt in the top of the uterus, and it spreads to the lower abdomen and low back. °· Contractions do not go away with walking. °· Contractions usually become more intense and increase in frequency. °· The cervix dilates and gets thinner. °False labor °· Contractions are usually shorter and not as strong as true labor contractions. °· Contractions are usually irregular. °· Contractions  are often felt in the front of the lower abdomen and in the groin. °· Contractions may go away when you walk around or change positions while lying down. °· Contractions get weaker and are shorter-lasting as time goes on. °· The cervix usually does not dilate or become thin. °Follow these instructions at home: °· Take over-the-counter and prescription medicines only as told by your health care provider. °· Keep up with your usual exercises and follow other instructions from your health care provider. °· Eat and drink lightly if you think you are going into labor. °· If Braxton Hicks contractions are making you uncomfortable: °? Change your position from lying down or resting to walking, or change from walking to resting. °? Sit and rest in a tub of warm water. °? Drink enough fluid to keep your urine pale yellow. Dehydration may cause these contractions. °? Do slow and deep breathing several times an hour. °· Keep all follow-up prenatal visits as told by your health care provider. This is important. °Contact a health care provider if: °· You have a fever. °· You have continuous pain in your abdomen. °Get help right away if: °· Your contractions become stronger, more regular, and closer together. °· You have fluid leaking or gushing from your vagina. °· You pass blood-tinged mucus (bloody show). °· You have bleeding from your vagina. °· You have low back pain that you never had before. °· You feel your baby’s head pushing down and causing pelvic pressure. °· Your baby is not moving inside you as much as it used to. °Summary °· Contractions that occur before labor are called Braxton   Hicks contractions, false labor, or practice contractions. °· Braxton Hicks contractions are usually shorter, weaker, farther apart, and less regular than true labor contractions. True labor contractions usually become progressively stronger and regular and they become more frequent. °· Manage discomfort from Braxton Hicks contractions by  changing position, resting in a warm bath, drinking plenty of water, or practicing deep breathing. °This information is not intended to replace advice given to you by your health care provider. Make sure you discuss any questions you have with your health care provider. °Document Released: 07/19/2016 Document Revised: 07/19/2016 Document Reviewed: 07/19/2016 °Elsevier Interactive Patient Education © 2018 [employer] Inc[employer] ° °

## 2017-04-16 NOTE — Progress Notes (Signed)
Pt is here for an ROB visit. No c/o. 

## 2017-04-16 NOTE — Progress Notes (Signed)
ROB doing well. No complaints . Feels good fetal movement and has occasional contractions. Labor precautions reviewed. Follow up 1 wk.   Doreene BurkeAnnie Djuan Talton, CNM

## 2017-04-23 ENCOUNTER — Ambulatory Visit (INDEPENDENT_AMBULATORY_CARE_PROVIDER_SITE_OTHER): Payer: Medicaid Other | Admitting: Certified Nurse Midwife

## 2017-04-23 VITALS — BP 112/78 | HR 86 | Wt 269.4 lb

## 2017-04-23 DIAGNOSIS — Z3493 Encounter for supervision of normal pregnancy, unspecified, third trimester: Secondary | ICD-10-CM

## 2017-04-23 LAB — POCT URINALYSIS DIPSTICK
Bilirubin, UA: NEGATIVE
GLUCOSE UA: NEGATIVE
Ketones, UA: NEGATIVE
LEUKOCYTES UA: NEGATIVE
NITRITE UA: NEGATIVE
RBC UA: NEGATIVE
SPEC GRAV UA: 1.02 (ref 1.010–1.025)
Urobilinogen, UA: 0.2 E.U./dL
pH, UA: 5 (ref 5.0–8.0)

## 2017-04-23 NOTE — Progress Notes (Signed)
Pt is here for an ROB visit. C/o [employer] CorporationBraxton Hicks contractions.

## 2017-04-23 NOTE — Patient Instructions (Signed)
Natural Childbirth Natural childbirth is going through labor and delivery without any drugs to relieve pain. Additionally, fetal monitors are not used, a delivery is not done, and a surgical cut to enlarge the vaginal opening (episiotomy) is not made. With the help of a birthing professional (midwife or health care provider), you direct your own labor and delivery. Many women choose natural childbirth because it makes them feel more in control and in touch with their labor and delivery. Some woman also choose natural childbirth because they are concerned about medicines affecting them and their babies. Pregnant women with a high-risk pregnancy should not attempt natural childbirth. It is better to deliver the infant in a hospital if an emergency situation arises. Sometimes, a health care provider has to get involved for the health and safety of the mother and infant. Techniques for natural childbirth  The Lamaze method-This method teaches parents that having a baby is normal, healthy, and natural. It also teaches the mother to take a neutral position regarding pain medicine and numbing medicines and to make an informed decision about using these medicines when the time comes.  The Bradley method (also called husband-coached birth)-This method teaches the father or partner to be the birth coach. It encourages the mother to exercise and eat a balanced, nutritious diet. It also involves relaxation and deep breathing exercises and preparing the parents for emergency situations. Methods of dealing with labor pain and delivery  Meditation.  Yoga.  Hypnosis.  Acupuncture.  Massage.  Changing positions (walking, rocking, showering, leaning on birth balls).  Lying in warm water or a whirlpool bath.  Finding an activity that keeps your mind off of the labor pain.  Listening to soft music.  Focusing on a particular object (visual imagery). Before going into labor  Be sure you and your spouse or  partner are in agreement about having a natural childbirth.  Decide if your health care provider or a midwife will deliver your baby.  Decide if you will have your baby in the hospital, at a birthing center, or at home.  If you have children, make plans to have someone take care of them when you go to the hospital or birthing center.  Know the distance and the time it takes to go to the hospital or birthing center. Practice going there and time it to be sure.  Have a bag packed with a nightgown, bathrobe, and toiletries. Be ready to take it with you when you go into labor.  Keep phone numbers of your family and friends handy if you need to call someone when you go into labor.  Your spouse or partner should go to all the natural childbirth technique classes.  Talk with your health care provider about the possibility of a medical emergency and what will happen if that occurs. Advantages of natural childbirth  You are in control of your labor and delivery.  You will not take medicines that could affect you and the baby.  There are no invasive procedures, such as an episiotomy.  You and your spouse or partner will work together, which can increase your bond with each other.  In most delivery centers, your family and friends can be involved in the labor and delivery process. Disadvantages of natural childbirth  You will experience pain during your labor and delivery.  The methods of helping relieve your labor pains may not work for you.  You may feel disappointed if you decide to change your mind during labor and not   have a natural childbirth. After the delivery  You will be very tired.  You will be uncomfortable because of your uterus contracting. You will feel soreness around the vagina.  You may feel cold and shaky. This is a natural reaction. This information is not intended to replace advice given to you by your health care provider. Make sure you discuss any questions you  have with your health care provider. Document Released: 02/16/2008 Document Revised: 08/11/2015 Document Reviewed: 11/10/2012 Elsevier Interactive Patient Education  2017 [employer] Inc.  

## 2017-04-25 ENCOUNTER — Other Ambulatory Visit: Payer: Self-pay

## 2017-04-25 ENCOUNTER — Inpatient Hospital Stay
Admission: EM | Admit: 2017-04-25 | Discharge: 2017-04-28 | DRG: 807 | Disposition: A | Discharge status: Home / Self Care | Payer: Medicaid Other | Attending: Certified Nurse Midwife | Admitting: Certified Nurse Midwife

## 2017-04-25 DIAGNOSIS — O99214 Obesity complicating childbirth: Secondary | ICD-10-CM | POA: Diagnosis present

## 2017-04-25 DIAGNOSIS — O4292 Full-term premature rupture of membranes, unspecified as to length of time between rupture and onset of labor: Secondary | ICD-10-CM | POA: Diagnosis present

## 2017-04-25 DIAGNOSIS — E669 Obesity, unspecified: Secondary | ICD-10-CM | POA: Diagnosis present

## 2017-04-25 DIAGNOSIS — Z87891 Personal history of nicotine dependence: Secondary | ICD-10-CM

## 2017-04-25 DIAGNOSIS — Z3A39 39 weeks gestation of pregnancy: Secondary | ICD-10-CM

## 2017-04-25 LAB — PROTEIN / CREATININE RATIO, URINE
Creatinine, Urine: 70 mg/dL
Protein Creatinine Ratio: 0.6 mg/mg{creat} — ABNORMAL HIGH (ref 0.00–0.15)
Total Protein, Urine: 42 mg/dL

## 2017-04-25 LAB — COMPREHENSIVE METABOLIC PANEL
ALT: 14 U/L (ref 14–54)
ANION GAP: 9 (ref 5–15)
AST: 32 U/L (ref 15–41)
Albumin: 3 g/dL — ABNORMAL LOW (ref 3.5–5.0)
Alkaline Phosphatase: 103 U/L (ref 38–126)
BUN: 9 mg/dL (ref 6–20)
CHLORIDE: 107 mmol/L (ref 101–111)
CO2: 19 mmol/L — AB (ref 22–32)
Calcium: 8.7 mg/dL — ABNORMAL LOW (ref 8.9–10.3)
Creatinine, Ser: 0.8 mg/dL (ref 0.44–1.00)
Glucose, Bld: 79 mg/dL (ref 65–99)
POTASSIUM: 4.8 mmol/L (ref 3.5–5.1)
SODIUM: 135 mmol/L (ref 135–145)
Total Bilirubin: 0.5 mg/dL (ref 0.3–1.2)
Total Protein: 7 g/dL (ref 6.5–8.1)

## 2017-04-25 LAB — URINE DRUG SCREEN, QUALITATIVE (ARMC ONLY)
Amphetamines, Ur Screen: NOT DETECTED
BARBITURATES, UR SCREEN: NOT DETECTED
BENZODIAZEPINE, UR SCRN: NOT DETECTED
CANNABINOID 50 NG, UR ~~LOC~~: POSITIVE — AB
Cocaine Metabolite,Ur ~~LOC~~: NOT DETECTED
MDMA (Ecstasy)Ur Screen: NOT DETECTED
Methadone Scn, Ur: NOT DETECTED
OPIATE, UR SCREEN: NOT DETECTED
PHENCYCLIDINE (PCP) UR S: NOT DETECTED
Tricyclic, Ur Screen: NOT DETECTED

## 2017-04-25 LAB — TYPE AND SCREEN
ABO/RH(D): A POS
ANTIBODY SCREEN: NEGATIVE

## 2017-04-25 LAB — CBC
HEMATOCRIT: 40 % (ref 35.0–47.0)
Hemoglobin: 13.5 g/dL (ref 12.0–16.0)
MCH: 32.8 pg (ref 26.0–34.0)
MCHC: 33.7 g/dL (ref 32.0–36.0)
MCV: 97.3 fL (ref 80.0–100.0)
Platelets: 159 10*3/uL (ref 150–440)
RBC: 4.11 MIL/uL (ref 3.80–5.20)
RDW: 13.4 % (ref 11.5–14.5)
WBC: 8 10*3/uL (ref 3.6–11.0)

## 2017-04-25 MED ORDER — OXYTOCIN 10 UNIT/ML IJ SOLN
INTRAMUSCULAR | Status: AC
Start: 2017-04-25 — End: 2017-04-26
  Filled 2017-04-25: qty 2

## 2017-04-25 MED ORDER — ACETAMINOPHEN 325 MG PO TABS: 650.0000 mg | ORAL_TABLET | ORAL | Status: DC | PRN

## 2017-04-25 MED ORDER — OXYCODONE-ACETAMINOPHEN 5-325 MG PO TABS: 2.0000 | ORAL_TABLET | ORAL | Status: DC | PRN

## 2017-04-25 MED ORDER — ONDANSETRON HCL 4 MG/2ML IJ SOLN: 4.0000 mg | Freq: Four times a day (QID) | INTRAMUSCULAR | Status: DC | PRN

## 2017-04-25 MED ORDER — MISOPROSTOL 200 MCG PO TABS
ORAL_TABLET | ORAL | Status: AC
  Filled 2017-04-25: qty 4

## 2017-04-25 MED ORDER — FLEET ENEMA 7-19 GM/118ML RE ENEM: 1.0000 | ENEMA | Freq: Once | RECTAL | Status: DC

## 2017-04-25 MED ORDER — LACTATED RINGERS IV SOLN: 500.0000 mL | INTRAVENOUS | Status: DC | PRN

## 2017-04-25 MED ORDER — LACTATED RINGERS IV SOLN
INTRAVENOUS | Status: DC
  Administered 2017-04-26: 03:00:00 via INTRAVENOUS

## 2017-04-25 MED ORDER — BUTORPHANOL TARTRATE 1 MG/ML IJ SOLN: 1.0000 mg | INTRAMUSCULAR | Status: DC | PRN

## 2017-04-25 MED ORDER — OXYCODONE-ACETAMINOPHEN 5-325 MG PO TABS: 1.0000 | ORAL_TABLET | ORAL | Status: DC | PRN

## 2017-04-25 MED ORDER — OXYTOCIN BOLUS FROM INFUSION
500.0000 mL | Freq: Once | INTRAVENOUS | Status: AC
  Administered 2017-04-26: 500 mL via INTRAVENOUS

## 2017-04-25 MED ORDER — AMMONIA AROMATIC IN INHA
RESPIRATORY_TRACT | Status: AC
  Filled 2017-04-25: qty 10

## 2017-04-25 MED ORDER — LIDOCAINE HCL (PF) 1 % IJ SOLN
30.0000 mL | INTRAMUSCULAR | Status: AC | PRN
  Administered 2017-04-26: 4 mL via SUBCUTANEOUS

## 2017-04-25 MED ORDER — OXYTOCIN 40 UNITS IN LACTATED RINGERS INFUSION - SIMPLE MED
2.5000 [IU]/h | INTRAVENOUS | Status: DC
  Filled 2017-04-25: qty 1000

## 2017-04-25 MED ORDER — LIDOCAINE HCL (PF) 1 % IJ SOLN
INTRAMUSCULAR | Status: AC
  Filled 2017-04-25: qty 30

## 2017-04-25 MED ORDER — SOD CITRATE-CITRIC ACID 500-334 MG/5ML PO SOLN: 30.0000 mL | ORAL | Status: DC | PRN

## 2017-04-25 NOTE — Progress Notes (Signed)
I personally spoke with the patient and discussed the current condition of her baby and fetal monitoring.  Benefits of internal monitors discussed.   Pt has agreed with IUPC and FSE and cervical exam. If cervix < 9 cm pt "has agreed" to internal monitoring. Ms. Belinda Gray present and will place the monitors.

## 2017-04-25 NOTE — OB Triage Note (Signed)
Pt states "she believes her water broke". Pt states she feels pressure in her pelvis for the past few weeks. Pt denies any pain. Pt states she is experiencing contractions 5-6 minutes apart. Pt states positive fetal movement. Pt denies vaginal bleeding. Pt states the "fluid leaked through her leggings but did not get on the bed." Pt states she just lost her mucous plug while in observation.

## 2017-04-25 NOTE — Progress Notes (Signed)
ROB-Doing well, ready to have baby. Sample packing list and birth plan template given. Reviewed red flag symptoms and when to call. RTC x 1 week for ROB or sooner if needed.

## 2017-04-25 NOTE — Progress Notes (Signed)
Ms. Jeralyn BennettLawhorn checked pt and placed FSE and IUPC.  She and the nurse unable to get both monitors to work together. After an hour of unsure fetal monitoring with dropouts and deceleractions, I asked Ms. Lawhorn what her plan was for monitoring and why this was still unsuccessful.  She said they were still trying to get the monitors to work.  I asked her to replace the FSE and offered to to do it.  She walked away and said fine then you take over and do it.   I spoke with the patient about the continued need to monitor and she agreed with the plan to replace the scalp lead.    FSE placed without problem Previous lead not tightly applied by palpation. Cervix = 5 cm 70%  Contractions noted to be coupling. Decelerations noted -  ? Variables - fetal variability remains good. Will follow closely.

## 2017-04-25 NOTE — H&P (Signed)
Obstetric History and Physical  Belinda ConceptionFelicia Gray is a 27 y.o. G2P0010 with IUP at 8421w0d presenting with leakage of clear fluid since 1600. Patient states she has been having  regular, every five (5) minutes contractions, none vaginal bleeding, clear fluid membranes, with active fetal movement.    Denies difficulty breathing or respiratory distress, chest pain, dysuria, and leg pain or swelling.   Prenatal Course  Source of Care: [facility]-initial visit at 17 weeks, total visits: 9  Pregnancy complications or risks: Obesity in pregnancy, Positive drug screen, Late entry prenatal care  Prenatal labs and studies:  ABO, Rh: A/Positive/-- (09/06 1128)  Antibody: Negative (09/06 1128)  Rubella: 4.97 (09/06 1128)  Varicella: 2, 707 (09/06 1128)  RPR: Non Reactive (11/28 0917)   HBsAg: Negative (09/06 1128)   HIV: Non Reactive (09/06 1128)   ZOX:WRUEAVWUGBS:Negative (01/22 1411)  1 hr Glucola: 75 (11/28 0839)  Genetic screening: Declined  Anatomy US: Complete (10/03 0813)  Past Medical History:  Diagnosis Date  . Depression   . Miscarriage 07/2016    Past Surgical History:  Procedure Laterality Date  . NONE      OB History  Gravida Para Term Preterm AB Living  2       1    SAB TAB Ectopic Multiple Live Births  1            # Outcome Date GA Lbr Len/2nd Weight Sex Delivery Anes PTL Lv  2 Current           1 SAB 07/2016        ND      Social History   Socioeconomic History  . Marital status: Single    Spouse name: None  . Number of children: None  . Years of education: None  . Highest education level: None  Social Needs  . Financial resource strain: None  . Food insecurity - worry: None  . Food insecurity - inability: None  . Transportation needs - medical: None  . Transportation needs - non-medical: None  Occupational History  . None  Tobacco Use  . Smoking status: Former Smoker    Packs/day: 0.50    Types: Cigarettes    Last attempt to quit: 08/2016    Years  since quitting: 0.6  . Smokeless tobacco: Never Used  Substance and Sexual Activity  . Alcohol use: No    Frequency: Never    Comment: occasional   . Drug use: No    Comment: former - quit 08/2016  . Sexual activity: Yes    Birth control/protection: None  Other Topics Concern  . None  Social History Narrative  . None    Family History  Problem Relation Age of Onset  . Rheum arthritis Maternal Grandmother   . Hyperlipidemia Maternal Grandfather     Medications Prior to Admission  Medication Sig Dispense Refill Last Dose  . Prenatal Vit-Fe Fumarate-FA (PRENATAL VITAMINS) 28-0.8 MG TABS Take by mouth.   Past Month at Unknown time    No Known Allergies  Review of Systems: Negative except for what is mentioned in HPI.  Physical Exam:  BP (!) 137/93 (BP Location: Left Arm)   Pulse 88   Temp 98.1 F (36.7 C) (Oral)   Resp 16   Ht 5\' 6"  (1.676 m)   Wt 269 lb (122 kg)   LMP 07/26/2016 (Exact Date)   BMI 43.42 kg/m   GENERAL: Well-developed, well-nourished female in no acute distress.   LUNGS: Clear to auscultation bilaterally.  HEART: Regular rate and rhythm.  ABDOMEN: Soft, nontender, nondistended, gravid.  EXTREMITIES: Nontender, no edema, 2+ distal pulses.  Cervical Exam: Dilation: 3 Effacement (%): 70 Station: -2, -3 Presentation: Vertex Exam by:: C. Johnson  FHT:  Baseline rate 135 bpm   Variability moderate  Accelerations present   Decelerations variable and prolonged  Contractions: Every five (5) to six (6) minutes, soft resting tone   Pertinent Labs/Studies:    No results found for this or any previous visit (from the past 24 hour(s)).  Assessment :  Belinda Gray is a 27 y.o. G2P0010 at [redacted]w[redacted]d being admitted for full term premature rupture of membranes, Rh positive, GBS negative  FHR Category II  Plan:  Admit to [facility], see orders. Place IV for hydration.   Reviewed red flag symptoms and when to call.   Reassess as needed.    Dr. Logan Bores notified of admission and plan of care.    Gunnar Bulla, CNM [facility], [facility]

## 2017-04-25 NOTE — Progress Notes (Signed)
Pt with EFM which are inadequately picking up contractions.  Class 2 fetal heart rate tracing with decelerations and multiple periods of inability to continuously monitor baby. I have advised Belinda Gray to place both FSE and IUPC to better evaluate and monitor condition of baby during labor.  According to Belinda Gray, the patient is not only refusing complete internal monitoring but is also refusing cervical examination for progress.

## 2017-04-25 NOTE — Progress Notes (Signed)
Patient ID: Marijo ConceptionFelicia Halle, female   DOB: 1990/11/16, 27 y.o.   MRN: [id number]  2126 Telephone call from Dr. Logan BoresEvans, advised IUPC and FSE placement. CNM to room to discuss plan of care with patient. Discussed external vs internal fetal monitoring as well as associated risks and benefits. Patient declines internal monitors and SVE at this time. Dr. Logan BoresEvans notified.    Gunnar BullaJenkins Michelle Fabiano Ginley, CNM [facility], [facility]

## 2017-04-26 ENCOUNTER — Inpatient Hospital Stay: Payer: Medicaid Other | Admitting: Anesthesiology

## 2017-04-26 DIAGNOSIS — O4292 Full-term premature rupture of membranes, unspecified as to length of time between rupture and onset of labor: Principal | ICD-10-CM

## 2017-04-26 DIAGNOSIS — Z3A39 39 weeks gestation of pregnancy: Secondary | ICD-10-CM

## 2017-04-26 MED ORDER — IBUPROFEN 600 MG PO TABS
600.0000 mg | ORAL_TABLET | Freq: Four times a day (QID) | ORAL | Status: DC
  Administered 2017-04-26 – 2017-04-28 (×8): 600 mg via ORAL
  Filled 2017-04-26 (×7): qty 1

## 2017-04-26 MED ORDER — ACETAMINOPHEN 325 MG PO TABS
650.0000 mg | ORAL_TABLET | ORAL | Status: DC | PRN
Start: 2017-04-26 — End: 2017-04-28

## 2017-04-26 MED ORDER — SIMETHICONE 80 MG PO CHEW: 80.0000 mg | CHEWABLE_TABLET | ORAL | Status: DC | PRN

## 2017-04-26 MED ORDER — LACTATED RINGERS IV SOLN
500.0000 mL | Freq: Once | INTRAVENOUS | Status: AC
  Administered 2017-04-26: 500 mL via INTRAVENOUS

## 2017-04-26 MED ORDER — ZOLPIDEM TARTRATE 5 MG PO TABS
5.0000 mg | ORAL_TABLET | Freq: Every evening | ORAL | Status: DC | PRN
Start: 2017-04-26 — End: 2017-04-28

## 2017-04-26 MED ORDER — PHENYLEPHRINE 40 MCG/ML (10ML) SYRINGE FOR IV PUSH (FOR BLOOD PRESSURE SUPPORT)
80.0000 ug | PREFILLED_SYRINGE | INTRAVENOUS | Status: DC | PRN
  Filled 2017-04-26: qty 5

## 2017-04-26 MED ORDER — EPHEDRINE 5 MG/ML INJ
10.0000 mg | INTRAVENOUS | Status: DC | PRN
  Filled 2017-04-26: qty 2

## 2017-04-26 MED ORDER — ONDANSETRON HCL 4 MG/2ML IJ SOLN: 4.0000 mg | INTRAMUSCULAR | Status: DC | PRN

## 2017-04-26 MED ORDER — PRENATAL MULTIVITAMIN CH
1.0000 | ORAL_TABLET | Freq: Every day | ORAL | Status: DC
  Administered 2017-04-26 – 2017-04-27 (×2): 1 via ORAL
  Filled 2017-04-26: qty 1

## 2017-04-26 MED ORDER — FENTANYL 2.5 MCG/ML W/ROPIVACAINE 0.15% IN NS 100 ML EPIDURAL (ARMC)
EPIDURAL | Status: AC
  Filled 2017-04-26: qty 100

## 2017-04-26 MED ORDER — PHENYLEPHRINE 40 MCG/ML (10ML) SYRINGE FOR IV PUSH (FOR BLOOD PRESSURE SUPPORT)
80.0000 ug | PREFILLED_SYRINGE | INTRAVENOUS | Status: DC | PRN
Start: 2017-04-26 — End: 2017-04-26
  Filled 2017-04-26: qty 5

## 2017-04-26 MED ORDER — ONDANSETRON HCL 4 MG PO TABS: 4.0000 mg | ORAL_TABLET | ORAL | Status: DC | PRN

## 2017-04-26 MED ORDER — BENZOCAINE-MENTHOL 20-0.5 % EX AERO: 1.0000 "application " | INHALATION_SPRAY | CUTANEOUS | Status: DC | PRN

## 2017-04-26 MED ORDER — DIPHENHYDRAMINE HCL 25 MG PO CAPS: 25.0000 mg | ORAL_CAPSULE | Freq: Four times a day (QID) | ORAL | Status: DC | PRN

## 2017-04-26 MED ORDER — TETANUS-DIPHTH-ACELL PERTUSSIS 5-2.5-18.5 LF-MCG/0.5 IM SUSP: 0.5000 mL | Freq: Once | INTRAMUSCULAR | Status: DC

## 2017-04-26 MED ORDER — SENNOSIDES-DOCUSATE SODIUM 8.6-50 MG PO TABS
2.0000 | ORAL_TABLET | ORAL | Status: DC
  Administered 2017-04-26 – 2017-04-27 (×2): 2 via ORAL
  Filled 2017-04-26 (×2): qty 2

## 2017-04-26 MED ORDER — DIBUCAINE 1 % RE OINT: 1.0000 "application " | TOPICAL_OINTMENT | RECTAL | Status: DC | PRN

## 2017-04-26 MED ORDER — DIPHENHYDRAMINE HCL 50 MG/ML IJ SOLN: 12.5000 mg | INTRAMUSCULAR | Status: DC | PRN

## 2017-04-26 MED ORDER — LIDOCAINE HCL (PF) 2 % IJ SOLN
INTRAMUSCULAR | Status: DC | PRN
  Administered 2017-04-26: 3 mL via EPIDURAL

## 2017-04-26 MED ORDER — FENTANYL 2.5 MCG/ML W/ROPIVACAINE 0.15% IN NS 100 ML EPIDURAL (ARMC)
12.0000 mL/h | EPIDURAL | Status: DC
  Administered 2017-04-26: 12 mL/h via EPIDURAL

## 2017-04-26 MED ORDER — WITCH HAZEL-GLYCERIN EX PADS: 1.0000 "application " | MEDICATED_PAD | CUTANEOUS | Status: DC | PRN

## 2017-04-26 MED ORDER — COCONUT OIL OIL: 1.0000 "application " | TOPICAL_OIL | Status: DC | PRN

## 2017-04-26 MED ORDER — BUPIVACAINE HCL (PF) 0.25 % IJ SOLN
INTRAMUSCULAR | Status: DC | PRN
  Administered 2017-04-26 (×2): 4 mL via EPIDURAL

## 2017-04-26 NOTE — Anesthesia Procedure Notes (Signed)
Epidural Patient location during procedure: OB  Staffing Performed: anesthesiologist   Preanesthetic Checklist Completed: patient identified, site marked, surgical consent, pre-op evaluation, timeout performed, IV checked, risks and benefits discussed and monitors and equipment checked  Epidural Patient position: sitting Prep: Betadine Patient monitoring: heart rate, continuous pulse ox and blood pressure Approach: midline Location: L4-L5 Injection technique: LOR saline  Needle:  Needle type: Tuohy  Needle gauge: 17 G Needle length: 9 cm and 9 Needle insertion depth: 8 cm Catheter type: closed end flexible Catheter size: 19 Gauge Catheter at skin depth: 13 cm Test dose: negative and 1.5% lidocaine with Epi 1:200 K  Assessment Sensory level: T10 Events: blood not aspirated, injection not painful, no injection resistance, negative IV test and no paresthesia  Additional Notes   Patient tolerated the insertion well without complications.-SATD -IVTD. No paresthesia. Refer to [facility] nursing for VS and dosingReason for block:procedure for pain     

## 2017-04-26 NOTE — Progress Notes (Signed)
0110 Dr Pernell DupreAdams on unit, made aware of pt decision to proceed with labor Epidural, nitrous d'ced. Pt assisted with getting into position sitting upright.  [id number] - Dr Pernell DupreAdams in room. Consent obtained

## 2017-04-26 NOTE — Anesthesia Preprocedure Evaluation (Signed)
Anesthesia Evaluation  Patient identified by MRN, date of birth, ID band Patient awake    Reviewed: Allergy & Precautions, H&P , NPO status , Patient's Chart, lab work & pertinent test results, reviewed documented beta blocker date and time   Airway Mallampati: II  TM Distance: >3 FB Neck ROM: full    Dental no notable dental hx. (+) Teeth Intact   Pulmonary neg pulmonary ROS, Current Smoker, former smoker,    Pulmonary exam normal breath sounds clear to auscultation       Cardiovascular Exercise Tolerance: Good negative cardio ROS   Rhythm:regular Rate:Normal     Neuro/Psych negative neurological ROS  negative psych ROS   GI/Hepatic negative GI ROS, Neg liver ROS,   Endo/Other  negative endocrine ROSdiabetes  Renal/GU      Musculoskeletal   Abdominal   Peds  Hematology negative hematology ROS (+)   Anesthesia Other Findings   Reproductive/Obstetrics (+) Pregnancy                             Anesthesia Physical Anesthesia Plan  ASA: II  Anesthesia Plan: Epidural   Post-op Pain Management:    Induction:   PONV Risk Score and Plan:   Airway Management Planned:   Additional Equipment:   Intra-op Plan:   Post-operative Plan:   Informed Consent: I have reviewed the patients History and Physical, chart, labs and discussed the procedure including the risks, benefits and alternatives for the proposed anesthesia with the patient or authorized representative who has indicated his/her understanding and acceptance.       Plan Discussed with:   Anesthesia Plan Comments:         Anesthesia Quick Evaluation  

## 2017-04-26 NOTE — Lactation Note (Signed)
This note was copied from a baby's chart. Lactation Consultation Note  Patient Name: Belinda Gray KGMWN'UToday's Date: 04/26/2017 Reason for consult: Initial assessment;[place] Mother has had a positive MJ screen on admission, counselled about our policy on breastfeeding while testing positive for THC, we rocommend pump and dump EBM until neg screen and feed formula, mom desires to continue to breastfeed as she has spoken to her midwife about this and was reassured by her to breastfeed.     Maternal Data Formula Feeding for Exclusion: No Has patient been taught Hand Expression?: Yes Does the patient have breastfeeding experience prior to this delivery?: No Mom has very large nipples and baby has small mouth and doesn't open well at present Feeding Feeding Type: Breast Fed Length of feed: 10 min(left breast) Baby sleepy at first attempt at 1100, gaggy and not opening mouth, after pumping to elongate mom's nipple, baby still disinterested but eventually did start opening mouth and searching for nipple, able to latch to nipple only and nursed with stimulation for approx. 10 min., mom able to hand express colostrum well     LATCH Score Latch: Repeated attempts needed to sustain latch, nipple held in mouth throughout feeding, stimulation needed to elicit sucking reflex.  Audible Swallowing: A few with stimulation  Type of Nipple: Everted at rest and after stimulation(large nipples)  Comfort (Breast/Nipple): Soft / non-tender  Hold (Positioning): Assistance needed to correctly position infant at breast and maintain latch.  LATCH Score: 7  Interventions Interventions: Assisted with latch;Pre-pump if needed;Breast compression;Adjust position;Support pillows;Position options;DEBP  Lactation Tools Discussed/Used WIC Program: No Pump Review: Setup, frequency, and cleaning Initiated by:: m Gerre CouchBarbee RNC IBCLC Date initiated:: 04/26/17   Consult Status Consult Status: Follow-up Date:  04/27/17 Follow-up type: In-patient    Belinda Gray 04/26/2017, 3:21 PM

## 2017-04-26 NOTE — Progress Notes (Signed)
I asked the nurse Okey Dupre(Rose) to contact Ms. Lawhorn to see if she was interested in collaborating on care for this patient and resuming her plan to do the delivery.  I was informed that Ms. Lawhorn was interested and was returning to L&D.  I asked Rose to check the patients cervix for change - her exam = 7.5 cm 80-90% 0 station.  Pt has tried NO for pain relief but is now requesting epidural.  Occasional decelerations continue, some late, but also good BTB variability. Contraction freq and intensity good.  Pt has also shown cervical change.  Plan: Continue close monitoring Epidural if desired.

## 2017-04-27 LAB — CBC
HEMATOCRIT: 33.5 % — AB (ref 35.0–47.0)
HEMOGLOBIN: 11.2 g/dL — AB (ref 12.0–16.0)
MCH: 32.7 pg (ref 26.0–34.0)
MCHC: 33.4 g/dL (ref 32.0–36.0)
MCV: 98.1 fL (ref 80.0–100.0)
Platelets: 136 10*3/uL — ABNORMAL LOW (ref 150–440)
RBC: 3.42 MIL/uL — AB (ref 3.80–5.20)
RDW: 13.7 % (ref 11.5–14.5)
WBC: 10.5 10*3/uL (ref 3.6–11.0)

## 2017-04-27 LAB — COMPREHENSIVE METABOLIC PANEL
ALBUMIN: 2.4 g/dL — AB (ref 3.5–5.0)
ALT: 11 U/L — AB (ref 14–54)
AST: 17 U/L (ref 15–41)
Alkaline Phosphatase: 81 U/L (ref 38–126)
Anion gap: 7 (ref 5–15)
BILIRUBIN TOTAL: 0.5 mg/dL (ref 0.3–1.2)
BUN: 13 mg/dL (ref 6–20)
CALCIUM: 8.2 mg/dL — AB (ref 8.9–10.3)
CO2: 22 mmol/L (ref 22–32)
Chloride: 106 mmol/L (ref 101–111)
Creatinine, Ser: 0.6 mg/dL (ref 0.44–1.00)
GFR calc Af Amer: >60 mL/min (ref >60)
GFR calc non Af Amer: >60 mL/min (ref >60)
GLUCOSE: 74 mg/dL (ref 65–99)
Potassium: 3.8 mmol/L (ref 3.5–5.1)
Sodium: 135 mmol/L (ref 135–145)
TOTAL PROTEIN: 5.8 g/dL — AB (ref 6.5–8.1)

## 2017-04-27 LAB — RPR: RPR Ser Ql: NONREACTIVE

## 2017-04-27 NOTE — Progress Notes (Signed)
Patient ID: Belinda ConceptionFelicia Gray, female   DOB: Jul 24, 1990, 27 y.o.   MRN: [id number]  Post Partum Day # 1, s/p SVD  Subjective:  Doing well, no questions or concerns. Infant in nursery.   Denies difficulty breathing or respiratory distress, chest pain, abdominal pain, excessive vaginal bleeding, dysuria, and leg pain or swelling.   Objective:  Temp:  [97.7 F (36.5 C)-98.7 F (37.1 C)] 97.9 F (36.6 C) (02/09 0820) Pulse Rate:  [76-91] 81 (02/09 0820) Resp:  [17-18] 18 (02/09 0820) BP: (117-129)/(70-92) 129/92 (02/09 0820) SpO2:  [100 %] 100 % (02/09 0820)  Physical Exam:   General: alert and cooperative   Lungs: clear to auscultation bilaterally  Breasts: normal appearance, no masses or tenderness  Heart: regular rate and rhythm, S1, S2 normal, no murmur, click, rub or gallop  Abdomen: soft, non-tender; bowel sounds normal; no masses,  no organomegaly  Pelvis: Lochia: appropriate, Uterine Fundus: firm  Extremities: DVT Evaluation: No evidence of DVT seen on physical exam. Negative Homan's sign.  Recent Labs    04/25/17 1927 04/27/17 0654  HGB 13.5 11.2*  HCT 40.0 33.5*    Assessment/Plan: Plan for discharge tomorrow, Breastfeeding and Lactation consult   LOS: 2 days    Gunnar BullaJenkins Michelle Alease Fait, CNM [facility] 04/27/2017 1:12 PM

## 2017-04-27 NOTE — Clinical Social Work Maternal (Signed)
  CLINICAL SOCIAL WORK MATERNAL/CHILD NOTE  Patient Details  Name: Belinda Gray MRN: [id number] Date of Birth: 15-Dec-1990  Date:  04/27/2017  Clinical Social Worker Initiating Note:  Santiago Bumpers, MSW, [place] Date/Time: Initiated:  04/27/17/1637     Child's Name:  Belinda Gray   Biological Parents:  Mother   Need for Interpreter:  None   Reason for Referral:  Current Substance Use/Substance Use During Pregnancy    Address:  [place] [address]    Phone number:  [phone] (home)     Additional phone number: None  Household Members/Support Persons (HM/SP):       HM/SP Name Relationship DOB or Age  HM/SP -1        HM/SP -2        HM/SP -3        HM/SP -4        HM/SP -5        HM/SP -6        HM/SP -7        HM/SP -8          Natural Supports (not living in the home):  Extended Family, Medical laboratory scientific officer, Friends, Immediate Family, Parent, Spouse/significant other   Professional Supports:     Employment: Full-time   Type of Work: Loss adjuster, chartered at [employer]   Education:  [place] arranged:    Museum/gallery curator Resources:  Multimedia programmer   Other Resources:      Cultural/Religious Considerations Which May Impact Care:  None reported  Strengths:  Ability to meet basic needs , Compliance with medical plan , Home prepared for child , Understanding of illness, Pediatrician chosen   Psychotropic Medications:         Pediatrician:    [employer]  Pediatrician List:   [place]      Pediatrician Fax Number:    Risk Factors/Current Problems:  Substance Use    Cognitive State:  Able to Concentrate , Alert , Linear Thinking , Insightful , Goal Oriented    Mood/Affect:  Calm    CSW Assessment: The CSW met with the patient and her infant at bedside to discuss the UDS for both the MOB and infant being positive  for cannabinoids. The MOB was engaged in the discussion and was bonding well with her child during the assessment. The patient stated that she last used cannabinoids in the form of edibles 2 weeks prior to birth to assist with nausea and appetite. The CSW provided psychoeducation about cannabis use during pregnancy and informed the patient that the CSW is mandated by law to inform CPS. The patient understood and remained calm during the discussion.  The CSW will make a formal report tomorrow to the on-call CPS worker as time permits. CSW has advised the patient that this report will not deter discharge to the community.  CSW Plan/Description:  Child Protective Service Report     Zettie Pho, LCSW 04/27/2017, 4:41 PM

## 2017-04-27 NOTE — Plan of Care (Signed)
Patient's vital signs stable; breastfeeding infant with good technique observed; tolerating regular diet well; good po fluids; voiding; fundus firm; small amount rubra lochia; pain controlled with po motrin.

## 2017-04-27 NOTE — Anesthesia Postprocedure Evaluation (Signed)
Anesthesia Post Note  Patient: Belinda Gray  Procedure(s) Performed: AN AD HOC LABOR EPIDURAL  Patient location during evaluation: Mother Baby Anesthesia Type: Epidural Level of consciousness: awake and alert Pain management: pain level controlled Vital Signs Assessment: post-procedure vital signs reviewed and stable Respiratory status: spontaneous breathing, nonlabored ventilation and respiratory function stable Cardiovascular status: stable Postop Assessment: no headache and no backache (has been able to ambulate) Anesthetic complications: no     Last Vitals:  Vitals:   04/27/17 0039 04/27/17 0820  BP: 117/70 (!) 129/92  Pulse: 91 81  Resp: 17 18  Temp: 36.7 C 36.6 C  SpO2: 100% 100%    Last Pain:  Vitals:   04/27/17 0820  TempSrc: Oral  PainSc:                  Cleda MccreedyJoseph K Piscitello

## 2017-04-27 NOTE — Plan of Care (Signed)
Afeb. VSS. Color good, skin w&d. BBS clear. Fundus is firm at U/-1 and Pt. Has scant amount of Lochia. Pt. Is up and ambulating with steady gait. Demonstrating aprop. Care of self.

## 2017-04-28 MED ORDER — IBUPROFEN 600 MG PO TABS: 600.0000 mg | ORAL_TABLET | Freq: Four times a day (QID) | ORAL | 0 refills | Status: DC

## 2017-04-28 NOTE — Discharge Summary (Signed)
  Obstetric Discharge Summary  Patient ID: Belinda Gray MRN: [id number] DOB/AGE: [birth year] 27 y.o.   Date of Admission: 04/25/2017 Belinda Gray, CNM Belinda Gray(D. Evans, MD)  Date of Discharge: Belinda Gray Belinda Gray, CNM Belinda Gray(A. Cherry, MD)  Admitting Diagnosis: Premature rupture of membrane at 8437w1d  Secondary Diagnosis: None  Mode of Delivery: normal spontaneous vaginal delivery     Discharge Diagnosis: No other diagnosis   Intrapartum Procedures: epidural, placement of fetal scalp electrode and placement of intrauterine catheter   Post partum procedures: None  Complications: Left labial laceration   Brief Hospital Course   Belinda Gray is a G2P1011 who had a SVD on 04/26/2017;  for further details of this birth, please refer to the delivey note.  Patient had an uncomplicated postpartum course.  By time of discharge on PPD#2, her pain was controlled on oral pain medications; she had appropriate lochia and was ambulating, voiding without difficulty and tolerating regular diet.  She was deemed stable for discharge to home.    Labs:  CBC Latest Ref Rng & Units 04/27/2017 04/25/2017 02/13/2017  WBC 3.6 - 11.0 K/uL 10.5 8.0 9.6  Hemoglobin 12.0 - 16.0 g/dL 11.2(L) 13.5 11.4  Hematocrit 35.0 - 47.0 % 33.5(L) 40.0 34.9  Platelets 150 - 440 K/uL 136(L) 159 198   A POS  Physical exam:   Temp:  [98.2 F (36.8 C)-98.5 F (36.9 C)] 98.5 F (36.9 C) (02/10 0821) Pulse Rate:  [78-86] 86 (02/10 0821) Resp:  [18] 18 (02/10 0821) BP: (122-131)/(65-76) 124/65 (02/10 0821) SpO2:  [100 %] 100 % (02/10 0821)  General: alert and no distress  Lochia: appropriate  Abdomen: soft, NT  Uterine Fundus: firm  Lacerations: healing well, no significant drainage, no dehiscence, no significant erythema  Extremities: No evidence of DVT seen on physical exam. No lower extremity edema.  Discharge Instructions: Per After Visit Summary.  Activity: Advance as tolerated. Pelvic rest for 6 weeks.  Also  refer to After Visit Summary  Diet: Regular  Medications:  Allergies as of 04/28/2017   No Known Allergies     Medication List    TAKE these medications   ibuprofen 600 MG tablet Commonly known as:  ADVIL,MOTRIN Take 1 tablet (600 mg total) by mouth every 6 (six) hours.   Prenatal Vitamins 28-0.8 MG Tabs Take by mouth.      Outpatient follow up:  Follow-up Information    Belinda Gray, CNM. Call.   Specialties:  Certified Nurse Midwife, Obstetrics and Gynecology, Radiology Why:  Please call to [facility] Contact information: [address] [place] [id number] [phone]          Postpartum contraception: natural family planning (NFP)  Discharged Condition: stable  Discharged to: home   Newborn Data:  Disposition:home with mother  Apgars: APGAR (1 MIN): 8   APGAR (5 MINS): 9       Baby Feeding: Breast   Belinda Gray, CNM [facility], [facility]

## 2017-04-28 NOTE — Clinical Social Work Note (Signed)
CSW has made mandated report to on-call CPS worker [place] [facility] [place] [facility]. CPS will visit the patient in the community and are not indicating barriers to discharge. CSW is signing off. Please consult should additional needs arise.  Argentina PonderKaren Martha Paulino Cork, MSW, Theresia MajorsLCSWA [phone]

## 2017-04-28 NOTE — Discharge Instructions (Signed)
Postpartum Care After Vaginal Delivery °The period of time right after you deliver your newborn is called the postpartum period. °What kind of medical care will I receive? °· You may continue to receive fluids and medicines through an IV tube inserted into one of your veins. °· If an incision was made near your vagina (episiotomy) or if you had some vaginal tearing during delivery, cold compresses may be placed on your episiotomy or your tear. This helps to reduce pain and swelling. °· You may be given a squirt bottle to use when you go to the bathroom. You may use this until you are comfortable wiping as usual. To use the squirt bottle, follow these steps: °? Before you urinate, fill the squirt bottle with warm water. Do not use hot water. °? After you urinate, while you are sitting on the toilet, use the squirt bottle to rinse the area around your urethra and vaginal opening. This rinses away any urine and blood. °? You may do this instead of wiping. As you start healing, you may use the squirt bottle before wiping yourself. Make sure to wipe gently. °? Fill the squirt bottle with clean water every time you use the bathroom. °· You will be given sanitary pads to wear. °How can I expect to feel? °· You may not feel the need to urinate for several hours after delivery. °· You will have some soreness and pain in your abdomen and vagina. °· If you are breastfeeding, you may have uterine contractions every time you breastfeed for up to several weeks postpartum. Uterine contractions help your uterus return to its normal size. °· It is normal to have vaginal bleeding (lochia) after delivery. The amount and appearance of lochia is often similar to a menstrual period in the first week after delivery. It will gradually decrease over the next few weeks to a dry, yellow-brown discharge. For most women, lochia stops completely by 6-8 weeks after delivery. Vaginal bleeding can vary from woman to woman. °· Within the first few  days after delivery, you may have breast engorgement. This is when your breasts feel heavy, full, and uncomfortable. Your breasts may also throb and feel hard, tightly stretched, warm, and tender. After this occurs, you may have milk leaking from your breasts. Your health care provider can help you relieve discomfort due to breast engorgement. Breast engorgement should go away within a few days. °· You may feel more sad or worried than normal due to hormonal changes after delivery. These feelings should not last more than a few days. If these feelings do not go away after several days, speak with your health care provider. °How should I care for myself? °· Tell your health care provider if you have pain or discomfort. °· Drink enough water to keep your urine clear or pale yellow. °· Wash your hands thoroughly with soap and water for at least 20 seconds after changing your sanitary pads, after using the toilet, and before holding or feeding your baby. °· If you are not breastfeeding, avoid touching your breasts a lot. Doing this can make your breasts produce more milk. °· If you become weak or lightheaded, or you feel like you might faint, ask for help before: °? Getting out of bed. °? Showering. °· Change your sanitary pads frequently. Watch for any changes in your flow, such as a sudden increase in volume, a change in color, the passing of large blood clots. If you pass a blood clot from your vagina, save it   to show to your health care provider. Do not flush blood clots down the toilet without having your health care provider look at them.  Make sure that all your vaccinations are up to date. This can help protect you and your baby from getting certain diseases. You may need to have immunizations done before you leave the hospital.  If desired, talk with your health care provider about methods of family planning or birth control (contraception). How can I start bonding with my baby? Spending as much time as  possible with your baby is very important. During this time, you and your baby can get to know each other and develop a bond. Having your baby stay with you in your room (rooming in) can give you time to get to know your baby. Rooming in can also help you become comfortable caring for your baby. Breastfeeding can also help you bond with your baby. How can I plan for returning home with my baby?  Make sure that you have a car seat installed in your vehicle. ? Your car seat should be checked by a certified car seat installer to make sure that it is installed safely. ? Make sure that your baby fits into the car seat safely.  Ask your health care provider any questions you have about caring for yourself or your baby. Make sure that you are able to contact your health care provider with any questions after leaving the hospital. This information is not intended to replace advice given to you by your health care provider. Make sure you discuss any questions you have with your health care provider. Document Released: 12/31/2006 Document Revised: 08/08/2015 Document Reviewed: 02/07/2015 Elsevier Interactive Patient Education  2018 [employer] Postpartum Depression and Baby Blues The postpartum period begins right after the birth of a baby. During this time, there is often a great amount of joy and excitement. It is also a time of many changes in the life of the parents. Regardless of how many times a mother gives birth, each child brings new challenges and dynamics to the family. It is not unusual to have feelings of excitement along with confusing shifts in moods, emotions, and thoughts. All mothers are at risk of developing postpartum depression or the "baby blues." These mood changes can occur right after giving birth, or they may occur many months after giving birth. The baby blues or postpartum depression can be mild or severe. Additionally, postpartum depression can go away rather quickly, or it can be a  long-term condition. What are the causes? Raised hormone levels and the rapid drop in those levels are thought to be a main cause of postpartum depression and the baby blues. A number of hormones change during and after pregnancy. Estrogen and progesterone usually decrease right after the delivery of your baby. The levels of thyroid hormone and various cortisol steroids also rapidly drop. Other factors that play a role in these mood changes include major life events and genetics. What increases the risk? If you have any of the following risks for the baby blues or postpartum depression, know what symptoms to watch out for during the postpartum period. Risk factors that may increase the likelihood of getting the baby blues or postpartum depression include:  Having a personal or family history of depression.  Having depression while being pregnant.  Having premenstrual mood issues or mood issues related to oral contraceptives.  Having a lot of life stress.  Having marital conflict.  Lacking a social support network.  Having a baby with special needs.  Having health problems, such as diabetes.  What are the signs or symptoms? Symptoms of baby blues include:  Brief changes in mood, such as going from extreme happiness to sadness.  Decreased concentration.  Difficulty sleeping.  Crying spells, tearfulness.  Irritability.  Anxiety.  Symptoms of postpartum depression typically begin within the first month after giving birth. These symptoms include:  Difficulty sleeping or excessive sleepiness.  Marked weight loss.  Agitation.  Feelings of worthlessness.  Lack of interest in activity or food.  Postpartum psychosis is a very serious condition and can be dangerous. Fortunately, it is rare. Displaying any of the following symptoms is cause for immediate medical attention. Symptoms of postpartum psychosis include:  Hallucinations and delusions.  Bizarre or disorganized  behavior.  Confusion or disorientation.  How is this diagnosed? A diagnosis is made by an evaluation of your symptoms. There are no medical or lab tests that lead to a diagnosis, but there are various questionnaires that a health care provider may use to identify those with the baby blues, postpartum depression, or psychosis. Often, a screening tool called the [place] Postnatal Depression Scale is used to diagnose depression in the postpartum period. How is this treated? The baby blues usually goes away on its own in 1-2 weeks. Social support is often all that is needed. You will be encouraged to get adequate sleep and rest. Occasionally, you may be given medicines to help you sleep. Postpartum depression requires treatment because it can last several months or longer if it is not treated. Treatment may include individual or group therapy, medicine, or both to address any social, physiological, and psychological factors that may play a role in the depression. Regular exercise, a healthy diet, rest, and social support may also be strongly recommended. Postpartum psychosis is more serious and needs treatment right away. Hospitalization is often needed. Follow these instructions at home:  Get as much rest as you can. Nap when the baby sleeps.  Exercise regularly. Some women find yoga and walking to be beneficial.  Eat a balanced and nourishing diet.  Do little things that you enjoy. Have a cup of tea, take a bubble bath, read your favorite magazine, or listen to your favorite music.  Avoid alcohol.  Ask for help with household chores, cooking, grocery shopping, or running errands as needed. Do not try to do everything.  Talk to people close to you about how you are feeling. Get support from your partner, family members, friends, or other new moms.  Try to stay positive in how you think. Think about the things you are grateful for.  Do not spend a lot of time alone.  Only take  over-the-counter or prescription medicine as directed by your health care provider.  Keep all your postpartum appointments.  Let your health care provider know if you have any concerns. Contact a health care provider if: You are having a reaction to or problems with your medicine. Get help right away if:  You have suicidal feelings.  You think you may harm the baby or someone else. This information is not intended to replace advice given to you by your health care provider. Make sure you discuss any questions you have with your health care provider. Document Released: 12/08/2003 Document Revised: 08/11/2015 Document Reviewed: 12/15/2012 Elsevier Interactive Patient Education  2017 [employer] Breastfeeding Choosing to breastfeed is one of the best decisions you can make for yourself and your baby. A change in hormones  during pregnancy causes your breasts to make breast milk in your milk-producing glands. Hormones prevent breast milk from being released before your baby is born. They also prompt milk flow after birth. Once breastfeeding has begun, thoughts of your baby, as well as his or her sucking or crying, can stimulate the release of milk from your milk-producing glands. Benefits of breastfeeding Research shows that breastfeeding offers many health benefits for infants and mothers. It also offers a cost-free and convenient way to feed your baby. For your baby  Your first milk (colostrum) helps your baby's digestive system to function better.  Special cells in your milk (antibodies) help your baby to fight off infections.  Breastfed babies are less likely to develop asthma, allergies, obesity, or type 2 diabetes. They are also at lower risk for sudden infant death syndrome (SIDS).  Nutrients in breast milk are better able to meet your babys needs compared to infant formula.  Breast milk improves your baby's brain development. For you  Breastfeeding helps to create a very  special bond between you and your baby.  Breastfeeding is convenient. Breast milk costs nothing and is always available at the correct temperature.  Breastfeeding helps to burn calories. It helps you to lose the weight that you gained during pregnancy.  Breastfeeding makes your uterus return faster to its size before pregnancy. It also slows bleeding (lochia) after you give birth.  Breastfeeding helps to lower your risk of developing type 2 diabetes, osteoporosis, rheumatoid arthritis, cardiovascular disease, and breast, ovarian, uterine, and endometrial cancer later in life. Breastfeeding basics Starting breastfeeding  Find a comfortable place to sit or lie down, with your neck and back well-supported.  Place a pillow or a rolled-up blanket under your baby to bring him or her to the level of your breast (if you are seated). Nursing pillows are specially designed to help support your arms and your baby while you breastfeed.  Make sure that your baby's tummy (abdomen) is facing your abdomen.  Gently massage your breast. With your fingertips, massage from the outer edges of your breast inward toward the nipple. This encourages milk flow. If your milk flows slowly, you may need to continue this action during the feeding.  Support your breast with 4 fingers underneath and your thumb above your nipple (make the letter "C" with your hand). Make sure your fingers are well away from your nipple and your babys mouth.  Stroke your baby's lips gently with your finger or nipple.  When your baby's mouth is open wide enough, quickly bring your baby to your breast, placing your entire nipple and as much of the areola as possible into your baby's mouth. The areola is the colored area around your nipple. ? More areola should be visible above your baby's upper lip than below the lower lip. ? Your baby's lips should be opened and extended outward (flanged) to ensure an adequate, comfortable latch. ? Your  baby's tongue should be between his or her lower gum and your breast.  Make sure that your baby's mouth is correctly positioned around your nipple (latched). Your baby's lips should create a seal on your breast and be turned out (everted).  It is common for your baby to suck about 2-3 minutes in order to start the flow of breast milk. Latching Teaching your baby how to latch onto your breast properly is very important. An improper latch can cause nipple pain, decreased milk supply, and poor weight gain in your baby. Also, if your  baby is not latched onto your nipple properly, he or she may swallow some air during feeding. This can make your baby fussy. Burping your baby when you switch breasts during the feeding can help to get rid of the air. However, teaching your baby to latch on properly is still the best way to prevent fussiness from swallowing air while breastfeeding. Signs that your baby has successfully latched onto your nipple  Silent tugging or silent sucking, without causing you pain. Infant's lips should be extended outward (flanged).  Swallowing heard between every 3-4 sucks once your milk has started to flow (after your let-down milk reflex occurs).  Muscle movement above and in front of his or her ears while sucking.  Signs that your baby has not successfully latched onto your nipple  Sucking sounds or smacking sounds from your baby while breastfeeding.  Nipple pain.  If you think your baby has not latched on correctly, slip your finger into the corner of your babys mouth to break the suction and place it between your baby's gums. Attempt to start breastfeeding again. Signs of successful breastfeeding Signs from your baby  Your baby will gradually decrease the number of sucks or will completely stop sucking.  Your baby will fall asleep.  Your baby's body will relax.  Your baby will retain a small amount of milk in his or her mouth.  Your baby will let go of your  breast by himself or herself.  Signs from you  Breasts that have increased in firmness, weight, and size 1-3 hours after feeding.  Breasts that are softer immediately after breastfeeding.  Increased milk volume, as well as a change in milk consistency and color by the fifth day of breastfeeding.  Nipples that are not sore, cracked, or bleeding.  Signs that your baby is getting enough milk  Wetting at least 1-2 diapers during the first 24 hours after birth.  Wetting at least 5-6 diapers every 24 hours for the first week after birth. The urine should be clear or pale yellow by the age of 5 days.  Wetting 6-8 diapers every 24 hours as your baby continues to grow and develop.  At least 3 stools in a 24-hour period by the age of 5 days. The stool should be soft and yellow.  At least 3 stools in a 24-hour period by the age of 7 days. The stool should be seedy and yellow.  No loss of weight greater than 10% of birth weight during the first 3 days of life.  Average weight gain of 4-7 oz (113-198 g) per week after the age of 4 days.  Consistent daily weight gain by the age of 5 days, without weight loss after the age of 2 weeks. After a feeding, your baby may spit up a small amount of milk. This is normal. Breastfeeding frequency and duration Frequent feeding will help you make more milk and can prevent sore nipples and extremely full breasts (breast engorgement). Breastfeed when you feel the need to reduce the fullness of your breasts or when your baby shows signs of hunger. This is called "breastfeeding on demand." Signs that your baby is hungry include:  Increased alertness, activity, or restlessness.  Movement of the head from side to side.  Opening of the mouth when the corner of the mouth or cheek is stroked (rooting).  Increased sucking sounds, smacking lips, cooing, sighing, or squeaking.  Hand-to-mouth movements and sucking on fingers or hands.  Fussing or crying.  Avoid  introducing a pacifier to your baby in the first 4-6 weeks after your baby is born. After this time, you may choose to use a pacifier. Research has shown that pacifier use during the first year of a baby's life decreases the risk of sudden infant death syndrome (SIDS). Allow your baby to feed on each breast as long as he or she wants. When your baby unlatches or falls asleep while feeding from the first breast, offer the second breast. Because newborns are often sleepy in the first few weeks of life, you may need to awaken your baby to get him or her to feed. Breastfeeding times will vary from baby to baby. However, the following rules can serve as a guide to help you make sure that your baby is properly fed:  Newborns (babies 30 weeks of age or younger) may breastfeed every 1-3 hours.  Newborns should not go without breastfeeding for longer than 3 hours during the day or 5 hours during the night.  You should breastfeed your baby a minimum of 8 times in a 24-hour period.  Breast milk pumping Pumping and storing breast milk allows you to make sure that your baby is exclusively fed your breast milk, even at times when you are unable to breastfeed. This is especially important if you go back to work while you are still breastfeeding, or if you are not able to be present during feedings. Your lactation consultant can help you find a method of pumping that works best for you and give you guidelines about how long it is safe to store breast milk. Caring for your breasts while you breastfeed Nipples can become dry, cracked, and sore while breastfeeding. The following recommendations can help keep your breasts moisturized and healthy:  Avoid using soap on your nipples.  Wear a supportive bra designed especially for nursing. Avoid wearing underwire-style bras or extremely tight bras (sports bras).  Air-dry your nipples for 3-4 minutes after each feeding.  Use only cotton bra pads to absorb leaked breast  milk. Leaking of breast milk between feedings is normal.  Use lanolin on your nipples after breastfeeding. Lanolin helps to maintain your skin's normal moisture barrier. Pure lanolin is not harmful (not toxic) to your baby. You may also hand express a few drops of breast milk and gently massage that milk into your nipples and allow the milk to air-dry.  In the first few weeks after giving birth, some women experience breast engorgement. Engorgement can make your breasts feel heavy, warm, and tender to the touch. Engorgement peaks within 3-5 days after you give birth. The following recommendations can help to ease engorgement:  Completely empty your breasts while breastfeeding or pumping. You may want to start by applying warm, moist heat (in the shower or with warm, water-soaked hand towels) just before feeding or pumping. This increases circulation and helps the milk flow. If your baby does not completely empty your breasts while breastfeeding, pump any extra milk after he or she is finished.  Apply ice packs to your breasts immediately after breastfeeding or pumping, unless this is too uncomfortable for you. To do this: ? Put ice in a plastic bag. ? Place a towel between your skin and the bag. ? Leave the ice on for 20 minutes, 2-3 times a day.  Make sure that your baby is latched on and positioned properly while breastfeeding.  If engorgement persists after 48 hours of following these recommendations, contact your health care provider or a Advertising copywriter. Overall  health care recommendations while breastfeeding  Eat 3 healthy meals and 3 snacks every day. Well-nourished mothers who are breastfeeding need an additional 450-500 calories a day. You can meet this requirement by increasing the amount of a balanced diet that you eat.  Drink enough water to keep your urine pale yellow or clear.  Rest often, relax, and continue to take your prenatal vitamins to prevent fatigue, stress, and low  vitamin and mineral levels in your body (nutrient deficiencies).  Do not use any products that contain nicotine or tobacco, such as cigarettes and e-cigarettes. Your baby may be harmed by chemicals from cigarettes that pass into breast milk and exposure to secondhand smoke. If you need help quitting, ask your health care provider.  Avoid alcohol.  Do not use illegal drugs or marijuana.  Talk with your health care provider before taking any medicines. These include over-the-counter and prescription medicines as well as vitamins and herbal supplements. Some medicines that may be harmful to your baby can pass through breast milk.  It is possible to become pregnant while breastfeeding. If birth control is desired, ask your health care provider about options that will be safe while breastfeeding your baby. Where to find more information: [employer]: www.llli.org Contact a health care provider if:  You feel like you want to stop breastfeeding or have become frustrated with breastfeeding.  Your nipples are cracked or bleeding.  Your breasts are red, tender, or warm.  You have: ? Painful breasts or nipples. ? A swollen area on either breast. ? A fever or chills. ? Nausea or vomiting. ? Drainage other than breast milk from your nipples.  Your breasts do not become full before feedings by the fifth day after you give birth.  You feel sad and depressed.  Your baby is: ? Too sleepy to eat well. ? Having trouble sleeping. ? More than 70 week old and wetting fewer than 6 diapers in a 24-hour period. ? Not gaining weight by 58 days of age.  Your baby has fewer than 3 stools in a 24-hour period.  Your baby's skin or the white parts of his or her eyes become yellow. Get help right away if:  Your baby is overly tired (lethargic) and does not want to wake up and feed.  Your baby develops an unexplained fever. Summary  Breastfeeding offers many health benefits for infant  and mothers.  Try to breastfeed your infant when he or she shows early signs of hunger.  Gently tickle or stroke your baby's lips with your finger or nipple to allow the baby to open his or her mouth. Bring the baby to your breast. Make sure that much of the areola is in your baby's mouth. Offer one side and burp the baby before you offer the other side.  Talk with your health care provider or lactation consultant if you have questions or you face problems as you breastfeed. This information is not intended to replace advice given to you by your health care provider. Make sure you discuss any questions you have with your health care provider. Document Released: 03/05/2005 Document Revised: 04/06/2016 Document Reviewed: 04/06/2016 Elsevier Interactive Patient Education  [employer].

## 2017-04-28 NOTE — Progress Notes (Signed)
Discharge instructions given. Patient verbalizes understanding of teaching. Patient discharged home via wheelchair at 12:35. 

## 2017-04-30 ENCOUNTER — Encounter: Payer: Medicaid Other | Admitting: Certified Nurse Midwife

## 2017-04-30 LAB — SURGICAL PATHOLOGY

## 2017-06-10 ENCOUNTER — Ambulatory Visit (INDEPENDENT_AMBULATORY_CARE_PROVIDER_SITE_OTHER): Payer: Medicaid Other | Admitting: Certified Nurse Midwife

## 2017-06-10 ENCOUNTER — Encounter: Payer: Self-pay | Admitting: Certified Nurse Midwife

## 2017-06-10 NOTE — Patient Instructions (Signed)
Preventive Care 18-39 Years, Female Preventive care refers to lifestyle choices and visits with your health care provider that can promote health and wellness. What does preventive care include?  A yearly physical exam. This is also called an annual well check.  Dental exams once or twice a year.  Routine eye exams. Ask your health care provider how often you should have your eyes checked.  Personal lifestyle choices, including: ? Daily care of your teeth and gums. ? Regular physical activity. ? Eating a healthy diet. ? Avoiding tobacco and drug use. ? Limiting alcohol use. ? Practicing safe sex. ? Taking vitamin and mineral supplements as recommended by your health care provider. What happens during an annual well check? The services and screenings done by your health care provider during your annual well check will depend on your age, overall health, lifestyle risk factors, and family history of disease. Counseling Your health care provider may ask you questions about your:  Alcohol use.  Tobacco use.  Drug use.  Emotional well-being.  Home and relationship well-being.  Sexual activity.  Eating habits.  Work and work Statistician.  Method of birth control.  Menstrual cycle.  Pregnancy history.  Screening You may have the following tests or measurements:  Height, weight, and BMI.  Diabetes screening. This is done by checking your blood sugar (glucose) after you have not eaten for a while (fasting).  Blood pressure.  Lipid and cholesterol levels. These may be checked every 5 years starting at age 66.  Skin check.  Hepatitis C blood test.  Hepatitis B blood test.  Sexually transmitted disease (STD) testing.  BRCA-related cancer screening. This may be done if you have a family history of breast, ovarian, tubal, or peritoneal cancers.  Pelvic exam and Pap test. This may be done every 3 years starting at age 40. Starting at age 59, this may be done every 5  years if you have a Pap test in combination with an HPV test.  Discuss your test results, treatment options, and if necessary, the need for more tests with your health care provider. Vaccines Your health care provider may recommend certain vaccines, such as:  Influenza vaccine. This is recommended every year.  Tetanus, diphtheria, and acellular pertussis (Tdap, Td) vaccine. You may need a Td booster every 10 years.  Varicella vaccine. You may need this if you have not been vaccinated.  HPV vaccine. If you are 69 or younger, you may need three doses over 6 months.  Measles, mumps, and rubella (MMR) vaccine. You may need at least one dose of MMR. You may also need a second dose.  Pneumococcal 13-valent conjugate (PCV13) vaccine. You may need this if you have certain conditions and were not previously vaccinated.  Pneumococcal polysaccharide (PPSV23) vaccine. You may need one or two doses if you smoke cigarettes or if you have certain conditions.  Meningococcal vaccine. One dose is recommended if you are age 27-21 years and a first-year college student living in a residence hall, or if you have one of several medical conditions. You may also need additional booster doses.  Hepatitis A vaccine. You may need this if you have certain conditions or if you travel or work in places where you may be exposed to hepatitis A.  Hepatitis B vaccine. You may need this if you have certain conditions or if you travel or work in places where you may be exposed to hepatitis B.  Haemophilus influenzae type b (Hib) vaccine. You may need this if  you have certain risk factors.  Talk to your health care provider about which screenings and vaccines you need and how often you need them. This information is not intended to replace advice given to you by your health care provider. Make sure you discuss any questions you have with your health care provider. Document Released: 05/01/2001 Document Revised: 11/23/2015  Document Reviewed: 01/04/2015 Elsevier Interactive Patient Education  [employer].

## 2017-06-10 NOTE — Progress Notes (Signed)
Subjective:    Belinda Gray is a 27 y.o. 132P1011 African American female who presents for a postpartum visit. She is 6 weeks postpartum following a spontaneous vaginal delivery at 39+1 gestational weeks. Anesthesia: epidural. I have fully reviewed the prenatal and intrapartum course.   Postpartum course has been uncomplicated. Baby's course has been uncomplicated. Baby is feeding by breast. Bleeding no bleeding. Bowel function is normal. Bladder function is normal.   Patient is not sexually active.Contraception method is LAM. Postpartum depression screening: negative. Score 0.  Last pap 2016 and was normal per patient.  Denies difficulty breathing or respiratory distress, chest pain, abdominal pain, vaginal bleeding, dysuria, and leg pain or swelling.   The following portions of the patient's history were reviewed and updated as appropriate: allergies, current medications, past medical history, past surgical history and problem list.  Review of Systems  Pertinent items are noted in HPI.   Objective:   BP 106/73   Pulse 76   Ht 5\' 6"  (1.676 m)   Wt 255 lb 14.4 oz (116.1 kg)   Breastfeeding? Yes   BMI 41.30 kg/m   General:  alert, cooperative and no distress   Breasts:  deferred, no complaints  Lungs: clear to auscultation bilaterally  Heart:  regular rate and rhythm  Abdomen: soft, nontender   Vulva: normal  Vagina: normal vagina  Cervix:  closed  Corpus: Well-involuted  Adnexa:  Non-palpable    [facility]/10/2017  Decreased Interest 0  Down, Depressed, Hopeless 0  PHQ - 2 Score 0  Altered sleeping 0  Tired, decreased energy 0  Change in appetite 0  Feeling bad or failure about yourself  0  Trouble concentrating 0  Moving slowly or fidgety/restless 0  Suicidal thoughts 0  PHQ-9 Score 0   Assessment:   Postpartum exam Six (6) wks s/p spontaneous vaginal delivery Breastfeeding Depression screening Contraception counseling   Plan:   May  return to work without restriction.   Reviewed red flag symptoms and when to call.   Follow up in: 4-6 months for Annual Exam or earlier if needed   Gunnar BullaJenkins Michelle Jovane Foutz, CNM [facility]

## 2017-06-11 ENCOUNTER — Encounter: Payer: Self-pay | Admitting: Certified Nurse Midwife

## 2018-06-29 IMAGING — CT CT ABD-PELV W/ CM
2 of 4 series · 16 of 46 positions shown, 18 images · IV contrast (APPLIED)
Comparison: Ultrasound pelvis 07/25/2016

CLINICAL DATA: Pelvic pain and nausea for 2-3 days. Right lower
quadrant pain. Home pregnancy test was negative. Vaginal discharge
today.

EXAM:
CT ABDOMEN AND PELVIS WITH CONTRAST
TECHNIQUE: Multidetector CT imaging of the abdomen and pelvis was performed
using the standard protocol following bolus administration of
intravenous contrast.
CONTRAST:  100mL [id number] IOPAMIDOL ([id number]) INJECTION 61%

[Series 2: routine abd/pel with · axial · 0.79mm/px · z∈[-569,-164]mm · 13 of 89 slices shown, 15 images]
[im 4/89  soft-tissue]
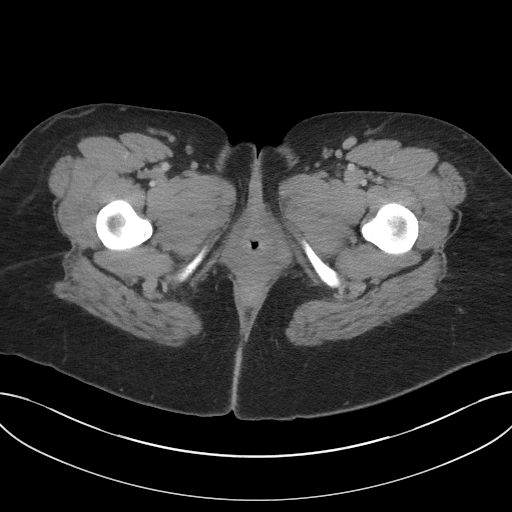
[im 4/89  bone]
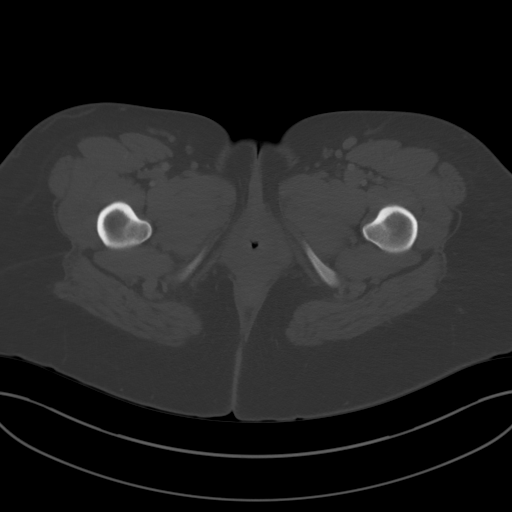
[im 11/89  soft-tissue]
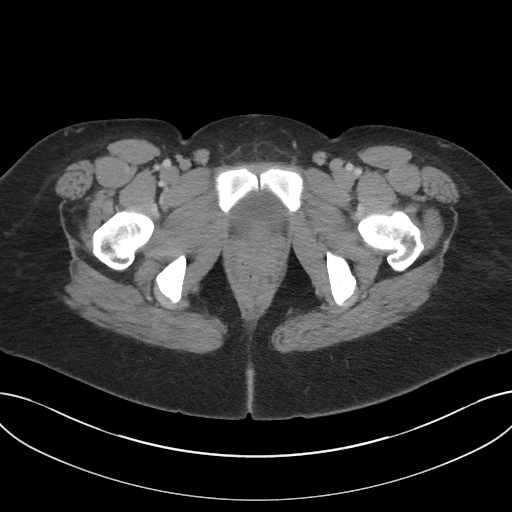
[im 18/89  soft-tissue]
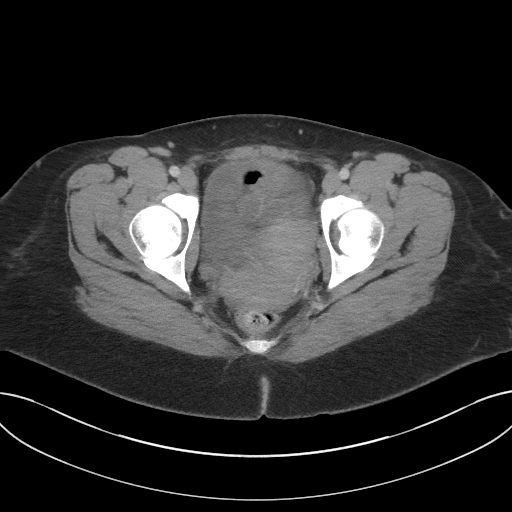
[im 25/89  soft-tissue]
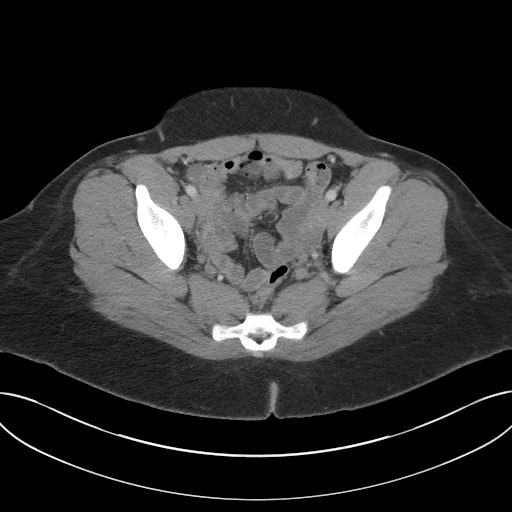
[im 32/89  soft-tissue]
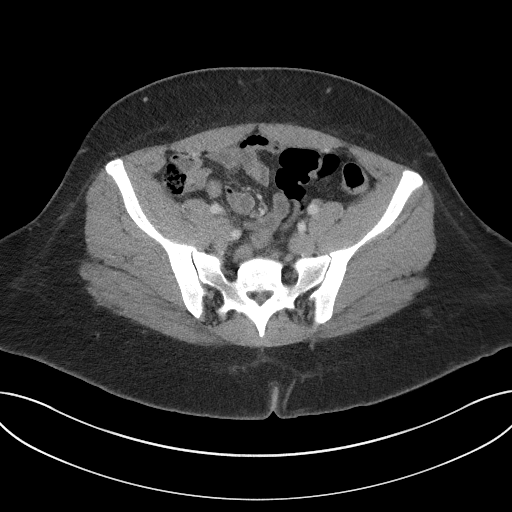
[im 39/89  soft-tissue]
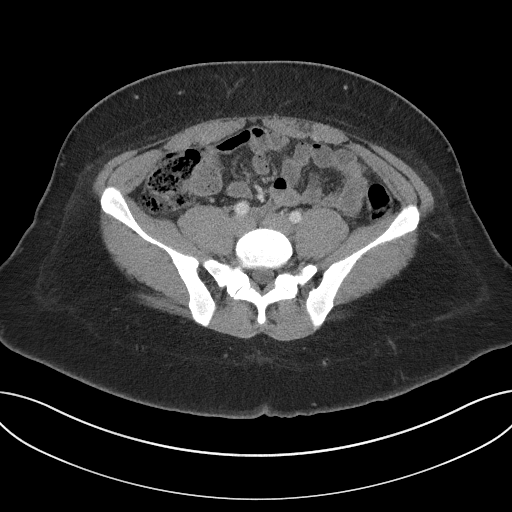
[im 46/89  soft-tissue]
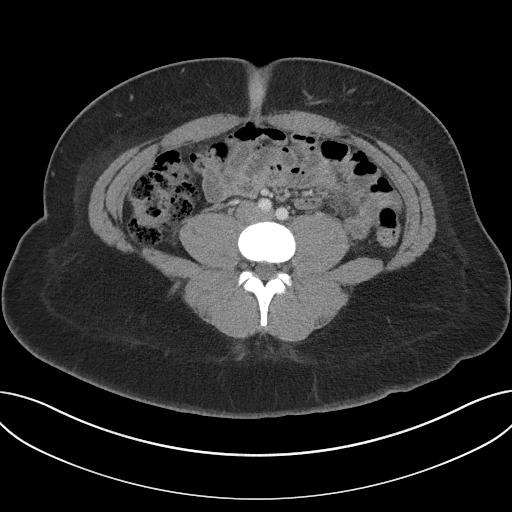
[im 50/89  soft-tissue]
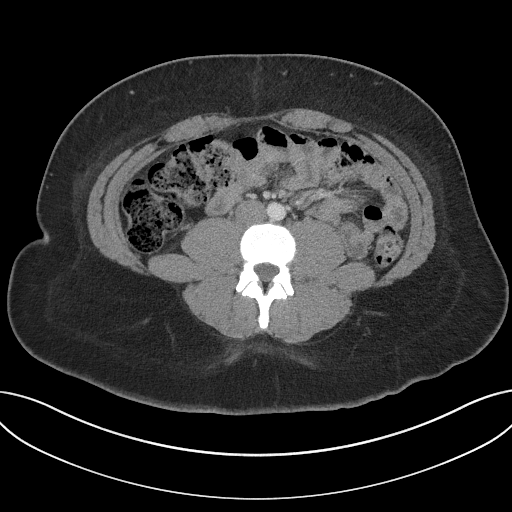
[im 57/89  soft-tissue]
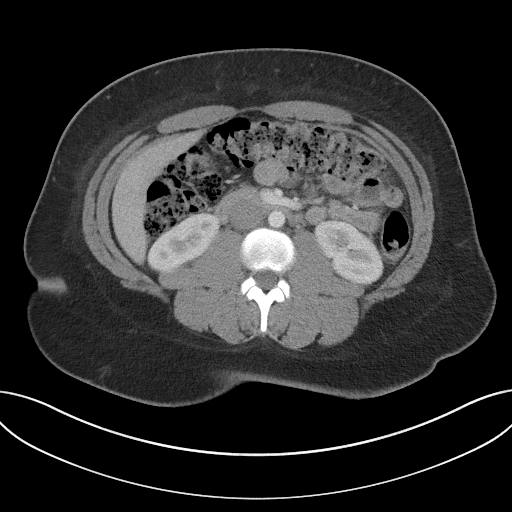
[im 57/89  bone]
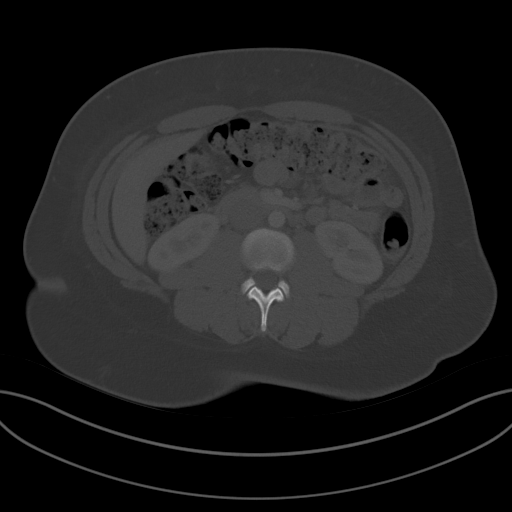
[im 64/89  soft-tissue]
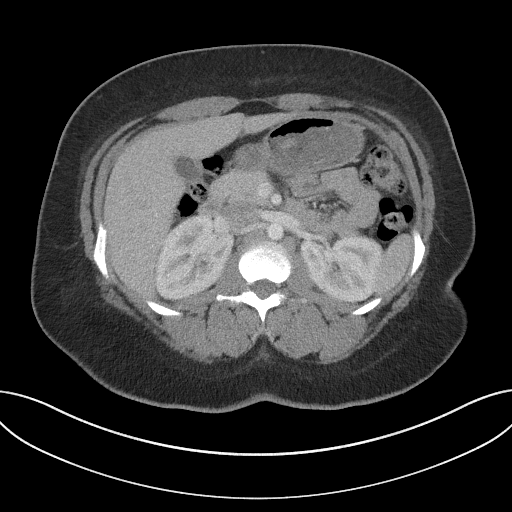
[im 71/89  soft-tissue]
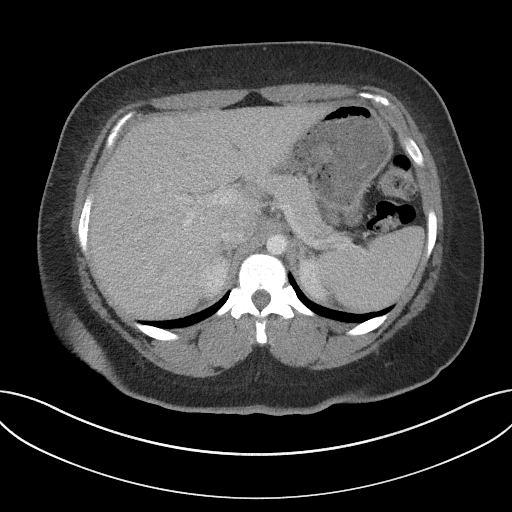
[im 78/89  soft-tissue]
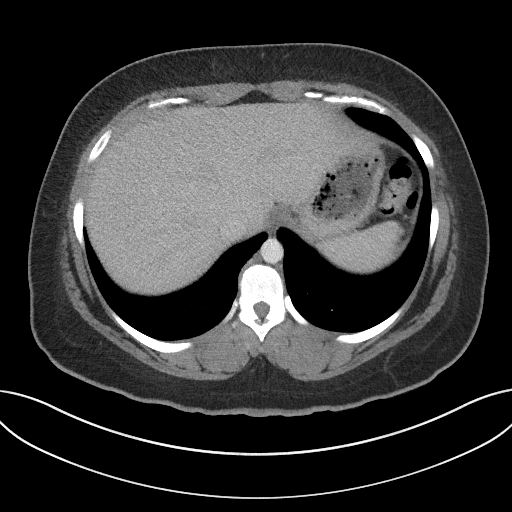
[im 85/89  soft-tissue]
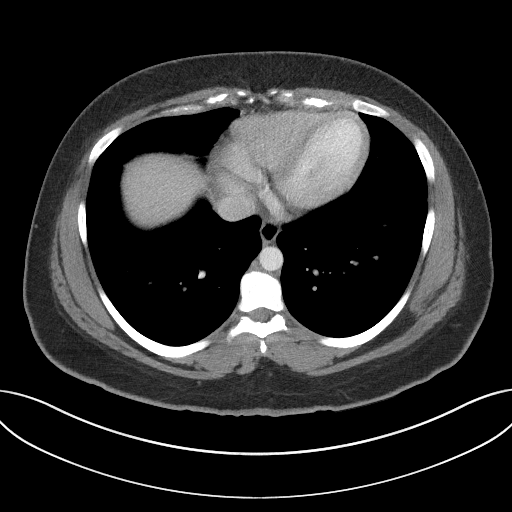

[Series 5: coronal st · coronal · 0.68mm/px · 3 of 97 slices shown]
[im 33/97  soft-tissue]
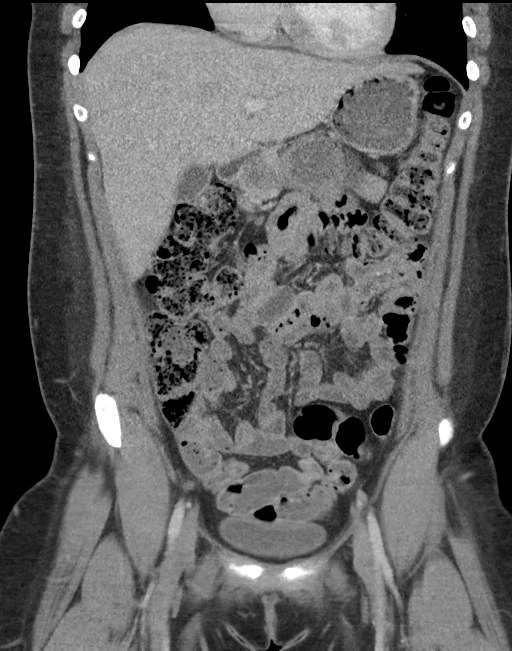
[im 43/97  soft-tissue]
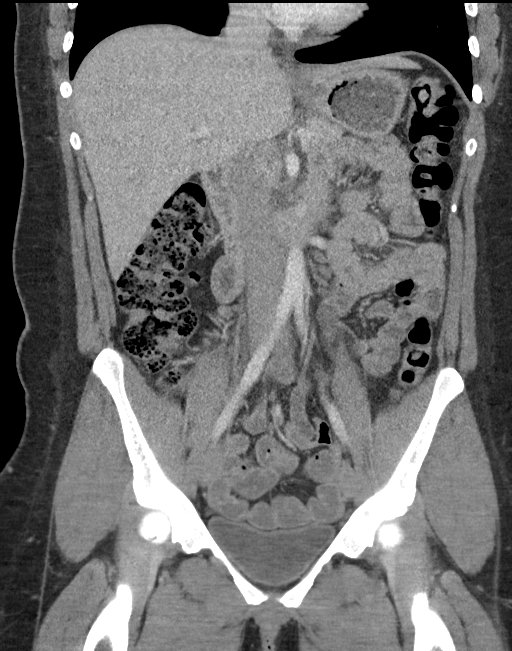
[im 54/97  soft-tissue]
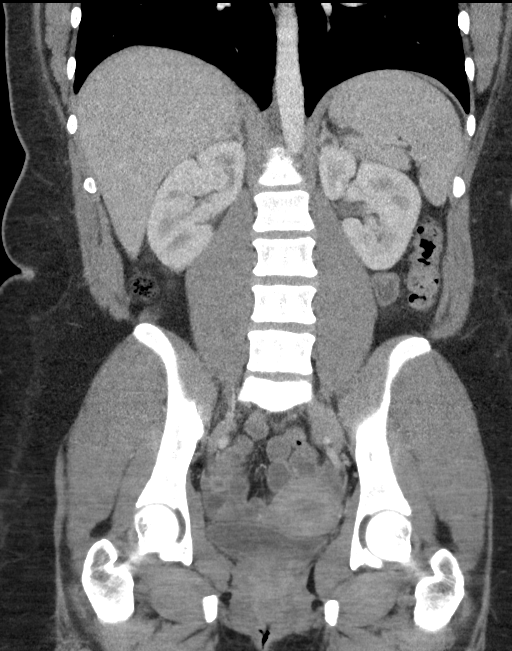

[16 of 46 positions shown; findings below may reference images not displayed]

FINDINGS: Lower chest: The lung bases are clear.

Hepatobiliary: No focal liver abnormality is seen. No gallstones,
gallbladder wall thickening, or biliary dilatation.

Pancreas: Unremarkable. No pancreatic ductal dilatation or
surrounding inflammatory changes.

Spleen: Normal in size without focal abnormality.

Adrenals/Urinary Tract: Adrenal glands are unremarkable. Kidneys are
normal, without renal calculi, focal lesion, or hydronephrosis.
Bladder is unremarkable.

Stomach/Bowel: Stomach is within normal limits. Appendix appears
normal. No evidence of bowel wall thickening, distention, or
inflammatory changes. Colon is stool filled.

Vascular/Lymphatic: No significant vascular findings are present. No
enlarged abdominal or pelvic lymph nodes.

Reproductive: Uterus and bilateral adnexa are unremarkable.

Other: No abdominal wall hernia or abnormality. No abdominopelvic
ascites.

Musculoskeletal: No acute or significant osseous findings.
IMPRESSION: No acute process demonstrated in the abdomen or pelvis. No evidence
of bowel obstruction or inflammation. Appendix is normal.
